# Patient Record
Sex: Female | Born: 1960 | Race: White | Hispanic: No | Marital: Married | Smoking: Never smoker
Health system: Southern US, Community
[De-identification: ages and names within clinical notes are randomized; demographics above are authoritative.]

## PROBLEM LIST (undated history)

## (undated) DIAGNOSIS — F419 Anxiety disorder, unspecified: Secondary | ICD-10-CM

## (undated) DIAGNOSIS — Z87442 Personal history of urinary calculi: Secondary | ICD-10-CM

## (undated) DIAGNOSIS — F319 Bipolar disorder, unspecified: Secondary | ICD-10-CM

## (undated) DIAGNOSIS — M1990 Unspecified osteoarthritis, unspecified site: Secondary | ICD-10-CM

## (undated) HISTORY — PX: KIDNEY STONE SURGERY: SHX686

## (undated) HISTORY — PX: TONSILLECTOMY: SUR1361

---

## 2015-10-07 ENCOUNTER — Encounter (HOSPITAL_COMMUNITY): Payer: Self-pay | Admitting: Emergency Medicine

## 2015-10-07 ENCOUNTER — Observation Stay (HOSPITAL_COMMUNITY)
Admission: EM | Admit: 2015-10-07 | Discharge: 2015-10-09 | Disposition: A | Discharge status: Home / Self Care | Payer: Managed Care, Other (non HMO) | Attending: Urology | Admitting: Urology

## 2015-10-07 DIAGNOSIS — N200 Calculus of kidney: Secondary | ICD-10-CM

## 2015-10-07 DIAGNOSIS — I252 Old myocardial infarction: Secondary | ICD-10-CM | POA: Insufficient documentation

## 2015-10-07 DIAGNOSIS — R109 Unspecified abdominal pain: Secondary | ICD-10-CM

## 2015-10-07 DIAGNOSIS — N201 Calculus of ureter: Secondary | ICD-10-CM | POA: Diagnosis present

## 2015-10-07 DIAGNOSIS — N132 Hydronephrosis with renal and ureteral calculous obstruction: Secondary | ICD-10-CM | POA: Diagnosis not present

## 2015-10-07 DIAGNOSIS — I2510 Atherosclerotic heart disease of native coronary artery without angina pectoris: Secondary | ICD-10-CM | POA: Insufficient documentation

## 2015-10-07 HISTORY — DX: Bipolar disorder, unspecified: F31.9

## 2015-10-07 LAB — BASIC METABOLIC PANEL
Anion gap: 10 (ref 5–15)
BUN: 13 mg/dL (ref 6–20)
CALCIUM: 9.7 mg/dL (ref 8.9–10.3)
CHLORIDE: 106 mmol/L (ref 101–111)
CO2: 24 mmol/L (ref 22–32)
CREATININE: 1.07 mg/dL — AB (ref 0.44–1.00)
GFR calc non Af Amer: 57 mL/min — ABNORMAL LOW (ref >60)
Glucose, Bld: 132 mg/dL — ABNORMAL HIGH (ref 65–99)
Potassium: 3.6 mmol/L (ref 3.5–5.1)
SODIUM: 140 mmol/L (ref 135–145)

## 2015-10-07 LAB — CBC WITH DIFFERENTIAL/PLATELET
BASOS PCT: 0 %
Basophils Absolute: 0 10*3/uL (ref 0.0–0.1)
EOS ABS: 0.2 10*3/uL (ref 0.0–0.7)
Eosinophils Relative: 2 %
HEMATOCRIT: 48.2 % — AB (ref 36.0–46.0)
Hemoglobin: 15.6 g/dL — ABNORMAL HIGH (ref 12.0–15.0)
LYMPHS ABS: 1.8 10*3/uL (ref 0.7–4.0)
Lymphocytes Relative: 13 %
MCH: 27.8 pg (ref 26.0–34.0)
MCHC: 32.4 g/dL (ref 30.0–36.0)
MCV: 85.8 fL (ref 78.0–100.0)
MONO ABS: 0.8 10*3/uL (ref 0.1–1.0)
MONOS PCT: 6 %
Neutro Abs: 10.7 10*3/uL — ABNORMAL HIGH (ref 1.7–7.7)
Neutrophils Relative %: 79 %
Platelets: 247 10*3/uL (ref 150–400)
RBC: 5.62 MIL/uL — ABNORMAL HIGH (ref 3.87–5.11)
RDW: 13.7 % (ref 11.5–15.5)
WBC: 13.5 10*3/uL — ABNORMAL HIGH (ref 4.0–10.5)

## 2015-10-07 MED ORDER — SODIUM CHLORIDE 0.9 % IV SOLN
Freq: Once | INTRAVENOUS | Status: AC
  Administered 2015-10-07: 23:00:00 via INTRAVENOUS

## 2015-10-07 MED ORDER — HYDROMORPHONE HCL 1 MG/ML IJ SOLN
1.0000 mg | Freq: Once | INTRAMUSCULAR | Status: AC
  Administered 2015-10-07: 1 mg via INTRAVENOUS
  Filled 2015-10-07: qty 1

## 2015-10-07 MED ORDER — ONDANSETRON HCL 4 MG/2ML IJ SOLN
4.0000 mg | Freq: Once | INTRAMUSCULAR | Status: AC
  Administered 2015-10-07: 4 mg via INTRAVENOUS
  Filled 2015-10-07: qty 2

## 2015-10-07 MED ORDER — KETOROLAC TROMETHAMINE 30 MG/ML IJ SOLN
30.0000 mg | Freq: Once | INTRAMUSCULAR | Status: AC
  Administered 2015-10-07: 30 mg via INTRAVENOUS
  Filled 2015-10-07: qty 1

## 2015-10-07 NOTE — ED Triage Notes (Signed)
Patient arrives with complaint of right groin pain and intermittent NV. States onset of aching type pain 1 week ago with increase in pain today and change in character to sharp. Patient sweating and crying out in pain while in triage.

## 2015-10-08 ENCOUNTER — Encounter (HOSPITAL_COMMUNITY): Payer: Self-pay | Admitting: Medical

## 2015-10-08 ENCOUNTER — Observation Stay (HOSPITAL_COMMUNITY): Payer: Managed Care, Other (non HMO) | Admitting: Registered Nurse

## 2015-10-08 ENCOUNTER — Encounter (HOSPITAL_COMMUNITY)
Admission: EM | Disposition: A | Discharge status: Home / Self Care | Payer: Self-pay | Source: Home / Self Care | Attending: Emergency Medicine

## 2015-10-08 ENCOUNTER — Emergency Department (HOSPITAL_COMMUNITY): Payer: Managed Care, Other (non HMO)

## 2015-10-08 DIAGNOSIS — N201 Calculus of ureter: Secondary | ICD-10-CM | POA: Diagnosis present

## 2015-10-08 HISTORY — PX: CYSTOSCOPY/RETROGRADE/URETEROSCOPY/STONE EXTRACTION WITH BASKET: SHX5317

## 2015-10-08 LAB — URINALYSIS, ROUTINE W REFLEX MICROSCOPIC
BILIRUBIN URINE: NEGATIVE
GLUCOSE, UA: NEGATIVE mg/dL
KETONES UR: 15 mg/dL — AB
Nitrite: NEGATIVE
PH: 8 (ref 5.0–8.0)
Protein, ur: NEGATIVE mg/dL
SPECIFIC GRAVITY, URINE: 1.014 (ref 1.005–1.030)

## 2015-10-08 LAB — HEPATIC FUNCTION PANEL
ALBUMIN: 4.3 g/dL (ref 3.5–5.0)
ALK PHOS: 46 U/L (ref 38–126)
ALT: 16 U/L (ref 14–54)
AST: 36 U/L (ref 15–41)
BILIRUBIN TOTAL: 0.5 mg/dL (ref 0.3–1.2)
Bilirubin, Direct: <0.1 mg/dL — ABNORMAL LOW (ref 0.1–0.5)
Total Protein: 7.1 g/dL (ref 6.5–8.1)

## 2015-10-08 LAB — URINE MICROSCOPIC-ADD ON

## 2015-10-08 SURGERY — CYSTOSCOPY, WITH CALCULUS REMOVAL USING BASKET
Anesthesia: General | Laterality: Right

## 2015-10-08 MED ORDER — ONDANSETRON HCL 4 MG/2ML IJ SOLN
4.0000 mg | INTRAMUSCULAR | Status: DC | PRN
  Administered 2015-10-08 (×2): 4 mg via INTRAVENOUS
  Filled 2015-10-08 (×2): qty 2

## 2015-10-08 MED ORDER — HYDROMORPHONE HCL 1 MG/ML IJ SOLN: 0.2500 mg | INTRAMUSCULAR | Status: DC | PRN

## 2015-10-08 MED ORDER — FENTANYL CITRATE (PF) 100 MCG/2ML IJ SOLN
50.0000 ug | INTRAMUSCULAR | Status: DC | PRN
  Administered 2015-10-08 (×5): 50 ug via INTRAVENOUS
  Filled 2015-10-08 (×5): qty 2

## 2015-10-08 MED ORDER — DEXAMETHASONE SODIUM PHOSPHATE 10 MG/ML IJ SOLN
INTRAMUSCULAR | Status: DC | PRN
  Administered 2015-10-08: 10 mg via INTRAVENOUS

## 2015-10-08 MED ORDER — PROPOFOL 10 MG/ML IV BOLUS
INTRAVENOUS | Status: DC | PRN
  Administered 2015-10-08: 200 mg via INTRAVENOUS

## 2015-10-08 MED ORDER — LACTATED RINGERS IV SOLN
INTRAVENOUS | Status: DC | PRN
  Administered 2015-10-08: 10:00:00 via INTRAVENOUS

## 2015-10-08 MED ORDER — FENTANYL CITRATE (PF) 100 MCG/2ML IJ SOLN
INTRAMUSCULAR | Status: DC | PRN
  Administered 2015-10-08: 100 ug via INTRAVENOUS

## 2015-10-08 MED ORDER — GENTAMICIN SULFATE 40 MG/ML IJ SOLN
500.0000 mg | Freq: Once | INTRAVENOUS | Status: AC
  Administered 2015-10-08: 500 mg via INTRAVENOUS
  Filled 2015-10-08: qty 12.5

## 2015-10-08 MED ORDER — SENNOSIDES-DOCUSATE SODIUM 8.6-50 MG PO TABS
1.0000 | ORAL_TABLET | Freq: Two times a day (BID) | ORAL | Status: DC
  Administered 2015-10-08 – 2015-10-09 (×2): 1 via ORAL
  Filled 2015-10-08 (×2): qty 1

## 2015-10-08 MED ORDER — DEXAMETHASONE SODIUM PHOSPHATE 10 MG/ML IJ SOLN
INTRAMUSCULAR | Status: AC
  Filled 2015-10-08: qty 1

## 2015-10-08 MED ORDER — FENTANYL CITRATE (PF) 100 MCG/2ML IJ SOLN
50.0000 ug | Freq: Once | INTRAMUSCULAR | Status: AC
  Administered 2015-10-08: 50 ug via INTRAVENOUS
  Filled 2015-10-08: qty 2

## 2015-10-08 MED ORDER — HYDROMORPHONE HCL 1 MG/ML IJ SOLN
0.5000 mg | INTRAMUSCULAR | Status: DC | PRN
  Administered 2015-10-08 (×2): 1 mg via INTRAVENOUS
  Filled 2015-10-08 (×2): qty 1

## 2015-10-08 MED ORDER — SUCCINYLCHOLINE CHLORIDE 20 MG/ML IJ SOLN
INTRAMUSCULAR | Status: DC | PRN
  Administered 2015-10-08: 140 mg via INTRAVENOUS

## 2015-10-08 MED ORDER — SODIUM CHLORIDE 0.9 % IR SOLN
Status: DC | PRN
  Administered 2015-10-08: 3000 mL

## 2015-10-08 MED ORDER — LABETALOL HCL 5 MG/ML IV SOLN
INTRAVENOUS | Status: DC | PRN
  Administered 2015-10-08: 5 mg via INTRAVENOUS

## 2015-10-08 MED ORDER — SODIUM CHLORIDE 0.9 % IV SOLN
INTRAVENOUS | Status: DC | PRN
  Administered 2015-10-08: 20 mL

## 2015-10-08 MED ORDER — MIDAZOLAM HCL 5 MG/5ML IJ SOLN
INTRAMUSCULAR | Status: DC | PRN
  Administered 2015-10-08: 2 mg via INTRAVENOUS

## 2015-10-08 MED ORDER — PROMETHAZINE HCL 25 MG/ML IJ SOLN
25.0000 mg | Freq: Four times a day (QID) | INTRAMUSCULAR | Status: DC | PRN
  Administered 2015-10-08: 25 mg via INTRAVENOUS
  Filled 2015-10-08: qty 1

## 2015-10-08 MED ORDER — ACETAMINOPHEN 10 MG/ML IV SOLN
INTRAVENOUS | Status: AC
  Filled 2015-10-08: qty 100

## 2015-10-08 MED ORDER — LAMOTRIGINE 100 MG PO TABS: 200.0000 mg | ORAL_TABLET | Freq: Every evening | ORAL | Status: DC

## 2015-10-08 MED ORDER — MIDAZOLAM HCL 2 MG/2ML IJ SOLN
INTRAMUSCULAR | Status: AC
  Filled 2015-10-08: qty 2

## 2015-10-08 MED ORDER — ONDANSETRON HCL 4 MG/2ML IJ SOLN
INTRAMUSCULAR | Status: DC | PRN
  Administered 2015-10-08: 4 mg via INTRAVENOUS

## 2015-10-08 MED ORDER — ONDANSETRON HCL 4 MG/2ML IJ SOLN
INTRAMUSCULAR | Status: AC
  Filled 2015-10-08: qty 2

## 2015-10-08 MED ORDER — QUETIAPINE FUMARATE 200 MG PO TABS
200.0000 mg | ORAL_TABLET | Freq: Every day | ORAL | Status: DC
  Administered 2015-10-08: 200 mg via ORAL
  Filled 2015-10-08: qty 1

## 2015-10-08 MED ORDER — KCL IN DEXTROSE-NACL 20-5-0.45 MEQ/L-%-% IV SOLN
INTRAVENOUS | Status: DC
  Administered 2015-10-08 – 2015-10-09 (×3): via INTRAVENOUS
  Filled 2015-10-08 (×4): qty 1000

## 2015-10-08 MED ORDER — ACETAMINOPHEN 10 MG/ML IV SOLN
INTRAVENOUS | Status: DC | PRN
  Administered 2015-10-08: 1000 mg via INTRAVENOUS

## 2015-10-08 MED ORDER — OXYCODONE-ACETAMINOPHEN 5-325 MG PO TABS
1.0000 | ORAL_TABLET | ORAL | Status: DC | PRN
  Administered 2015-10-08 – 2015-10-09 (×2): 2 via ORAL
  Filled 2015-10-08 (×2): qty 2

## 2015-10-08 MED ORDER — LIDOCAINE HCL (CARDIAC) 10 MG/ML IV SOLN
INTRAVENOUS | Status: DC | PRN
  Administered 2015-10-08: 75 mg via INTRAVENOUS

## 2015-10-08 MED ORDER — LURASIDONE HCL 20 MG PO TABS
20.0000 mg | ORAL_TABLET | Freq: Every evening | ORAL | Status: DC
  Filled 2015-10-08: qty 1

## 2015-10-08 MED ORDER — KETOROLAC TROMETHAMINE 30 MG/ML IJ SOLN
INTRAMUSCULAR | Status: AC
  Filled 2015-10-08: qty 1

## 2015-10-08 MED ORDER — FENTANYL CITRATE (PF) 100 MCG/2ML IJ SOLN
INTRAMUSCULAR | Status: AC
  Filled 2015-10-08: qty 2

## 2015-10-08 MED ORDER — LIDOCAINE 2% (20 MG/ML) 5 ML SYRINGE
INTRAMUSCULAR | Status: AC
  Filled 2015-10-08: qty 5

## 2015-10-08 MED ORDER — ALPRAZOLAM 1 MG PO TABS
1.0000 mg | ORAL_TABLET | Freq: Three times a day (TID) | ORAL | Status: DC | PRN
  Administered 2015-10-09: 1 mg via ORAL
  Filled 2015-10-08: qty 1

## 2015-10-08 MED ORDER — KETOROLAC TROMETHAMINE 15 MG/ML IJ SOLN
15.0000 mg | Freq: Four times a day (QID) | INTRAMUSCULAR | Status: DC
  Administered 2015-10-08 – 2015-10-09 (×3): 15 mg via INTRAVENOUS
  Filled 2015-10-08 (×3): qty 1

## 2015-10-08 MED ORDER — ONDANSETRON HCL 4 MG/2ML IJ SOLN
4.0000 mg | Freq: Once | INTRAMUSCULAR | Status: AC
  Administered 2015-10-08: 4 mg via INTRAVENOUS
  Filled 2015-10-08: qty 2

## 2015-10-08 MED ORDER — PROPOFOL 10 MG/ML IV BOLUS
INTRAVENOUS | Status: AC
  Filled 2015-10-08: qty 20

## 2015-10-08 SURGICAL SUPPLY — 20 items
BAG URO CATCHER STRL LF (MISCELLANEOUS) ×2 IMPLANT
BASKET LASER NITINOL 1.9FR (BASKET) IMPLANT
CATH INTERMIT  6FR 70CM (CATHETERS) ×2 IMPLANT
CLOTH BEACON ORANGE TIMEOUT ST (SAFETY) ×2 IMPLANT
FIBER LASER FLEXIVA 1000 (UROLOGICAL SUPPLIES) IMPLANT
FIBER LASER FLEXIVA 365 (UROLOGICAL SUPPLIES) ×2 IMPLANT
FIBER LASER FLEXIVA 550 (UROLOGICAL SUPPLIES) IMPLANT
FIBER LASER TRAC TIP (UROLOGICAL SUPPLIES) IMPLANT
GLOVE BIOGEL M STRL SZ7.5 (GLOVE) ×2 IMPLANT
GOWN STRL REUS W/TWL LRG LVL3 (GOWN DISPOSABLE) ×4 IMPLANT
GUIDEWIRE ANG ZIPWIRE 038X150 (WIRE) ×2 IMPLANT
GUIDEWIRE STR DUAL SENSOR (WIRE) ×2 IMPLANT
IV NS 1000ML (IV SOLUTION) ×1
IV NS 1000ML BAXH (IV SOLUTION) ×1 IMPLANT
MANIFOLD NEPTUNE II (INSTRUMENTS) ×2 IMPLANT
PACK CYSTO (CUSTOM PROCEDURE TRAY) ×2 IMPLANT
STENT POLARIS 5FRX24 (STENTS) ×2 IMPLANT
SYR CONTROL 10ML LL (SYRINGE) ×2 IMPLANT
TUBE FEEDING 8FR 16IN STR KANG (MISCELLANEOUS) ×2 IMPLANT
TUBING CONNECTING 10 (TUBING) ×2 IMPLANT

## 2015-10-08 NOTE — ED Notes (Signed)
Attempted to call report

## 2015-10-08 NOTE — Anesthesia Postprocedure Evaluation (Signed)
Anesthesia Post Note  Patient: Nestor LewandowskyMarsha Gilvin  Procedure(s) Performed: Procedure(s) (LRB): CYSTOSCOPY/RETROGRADE/URETEROSCOPY/STONE EXTRACTION WITH BASKET LASER (Right)  Patient location during evaluation: PACU Anesthesia Type: General Level of consciousness: awake Pain management: pain level controlled Vital Signs Assessment: post-procedure vital signs reviewed and stable Respiratory status: spontaneous breathing Cardiovascular status: stable Anesthetic complications: no    Last Vitals:  Vitals:   10/08/15 1200 10/08/15 1223  BP: (!) 146/81 (!) 147/83  Pulse: 84 83  Resp: 13 14  Temp: 36.8 C 37 C    Last Pain:  Vitals:   10/08/15 1200  TempSrc:   PainSc: 0-No pain                 EDWARDS,Myonna Chisom

## 2015-10-08 NOTE — ED Notes (Signed)
Paged urology x2

## 2015-10-08 NOTE — Anesthesia Preprocedure Evaluation (Addendum)
Anesthesia Evaluation  Patient identified by MRN, date of birth, ID band Patient awake  General Assessment Comment:Patient presenting with severe n/v in preop hoding area. Risks of GA particularly aspiration discussed with patient in detail. Dr. Berneice HeinrichManny informed as well. CE  Reviewed: Allergy & Precautions, NPO status , Patient's Chart, lab work & pertinent test results  Airway Mallampati: II  TM Distance: >3 FB     Dental   Pulmonary    breath sounds clear to auscultation       Cardiovascular + CAD and + Past MI   Rhythm:Regular Rate:Normal     Neuro/Psych    GI/Hepatic Neg liver ROS, GI history noted. CE   Endo/Other  negative endocrine ROS  Renal/GU History noted. CE     Musculoskeletal   Abdominal   Peds  Hematology   Anesthesia Other Findings   Reproductive/Obstetrics                            Anesthesia Physical Anesthesia Plan  ASA: III  Anesthesia Plan: General   Post-op Pain Management:    Induction: Intravenous, Rapid sequence and Cricoid pressure planned  Airway Management Planned: Oral ETT  Additional Equipment:   Intra-op Plan:   Post-operative Plan: Possible Post-op intubation/ventilation  Informed Consent: I have reviewed the patients History and Physical, chart, labs and discussed the procedure including the risks, benefits and alternatives for the proposed anesthesia with the patient or authorized representative who has indicated his/her understanding and acceptance.   Dental advisory given  Plan Discussed with: CRNA and Anesthesiologist  Anesthesia Plan Comments:        Anesthesia Quick Evaluation

## 2015-10-08 NOTE — ED Provider Notes (Signed)
Care assumed from [facility], [place]C.  Nestor LewandowskyMarsha Cabler is a 55 y.o. female presents with right sided flank pain and long history of her current kidney stones often with surgical management. She reports they are calcium stones. Patient [place] and has not established care with a primary care or urologist here. Symptoms today are the same as previous stones.  She reports that the pain is sharp and stabbing and severe with radiation to the groin. She has had associated nausea and vomiting along with dysuria.   Physical Exam  BP 153/76   Pulse 96   Temp 97.6 F (36.4 C) (Oral)   Resp 14   SpO2 98%   Physical Exam  Constitutional: She appears well-developed and well-nourished. She appears distressed.  HENT:  Head: Normocephalic.  Eyes: Conjunctivae are normal. No scleral icterus.  Neck: Normal range of motion.  Cardiovascular: Normal rate and intact distal pulses.   Pulmonary/Chest: Effort normal.  Musculoskeletal: Normal range of motion.  Neurological: She is alert.  Skin: Skin is warm. She is diaphoretic. There is pallor.   Clinical Course  Value Comment By Time   Pt with hx of 30+ kidney stones.   Dierdre ForthHannah Calyse Murcia, PA-C 09/23 0206  CT Renal Stone Study 6 x 4 mm stone with severe hydronephrosis.   Dahlia ClientHannah Jasia Hiltunen, PA-C 09/23 0206  Leukocytes, UA: (!) SMALL Urinalysis without evidence of urinary tract infection. Dierdre ForthHannah Marylynn Rigdon, PA-C 09/23 0220   Long discussion with patient regarding CT scan, size of stone. Pain control. She reports that a stone this size will need surgical management and wishes for admission. She does not wish to try outpatient trial of stone passage. Dierdre ForthHannah Georgeanne Frankland, PA-C 09/23 0221   Discussed with Dr. Berneice HeinrichManny who will evaluate this AM Dierdre ForthHannah Tabria Steines, PA-C 09/23 0432   [facility] Dierdre ForthHannah Maclain Cohron, PA-C 09/23 [id number]    ED Course  Procedures  Right flank pain  Kidney  stone    MDM  Plan: CT scan pending.  Will dispo accordingly.     6:33 AM Pt with refractory renal colic and obstructing stone of 6mm.  Pt evaluated by Dr. Berneice HeinrichManny who [facility].       Dahlia ClientHannah Sheriece Jefcoat, PA-C 10/08/15 [phone]    Shon Batonourtney F Horton, MD 10/09/15 [phone]

## 2015-10-08 NOTE — Anesthesia Procedure Notes (Signed)
Procedure Name: Intubation Date/Time: 10/08/2015 10:49 AM Performed by: Illene SilverEVANS, Gwynn Chalker E Pre-anesthesia Checklist: Patient identified, Emergency Drugs available, Suction available and Patient being monitored Patient Re-evaluated:Patient Re-evaluated prior to inductionOxygen Delivery Method: Circle system utilized Preoxygenation: Pre-oxygenation with 100% oxygen Intubation Type: IV induction, Rapid sequence and Cricoid Pressure applied Laryngoscope Size: Mac and 4 Grade View: Grade I Tube type: Oral Tube size: 7.5 mm Number of attempts: 1 Airway Equipment and Method: Stylet and Oral airway Placement Confirmation: ETT inserted through vocal cords under direct vision,  positive ETCO2 and breath sounds checked- equal and bilateral Secured at: 21 cm Tube secured with: Tape Dental Injury: Teeth and Oropharynx as per pre-operative assessment

## 2015-10-08 NOTE — ED Notes (Signed)
Dr. Gardiner RhymeZavits and Tresa EndoKelly PA preforming bedside us

## 2015-10-08 NOTE — Addendum Note (Signed)
Addendum  created 10/08/15 1301 by Illene SilverJanet E Norrine Ballester, CRNA   Anesthesia Intra Meds edited

## 2015-10-08 NOTE — Progress Notes (Signed)
Pt still with some nausea and emesis post-op, improved some. JJ stent also displaced after voiding and exiting urethral meatus.   I pulled stent out comletely.  Change nausea med to promethazine.  Remain in house, likely DC tomorrow AM.

## 2015-10-08 NOTE — ED Provider Notes (Signed)
[facility]-EMERGENCY DEPT Provider Note   CSN: [id number] Arrival date & time: 10/07/15  2158     History   Chief Complaint Chief Complaint  Patient presents with  . Groin Pain  . Nephrolithiasis    HPI Elizabeth Prince is a 55 y.o. female who presents with R flank and groin pain. PMH significant for recurrent kidney stones, CAD, hx of MI, bipolar d/o. She estimates she has had over 20 stones in her life. Has had them analyzed in the past and they were Ca stones. Past surgical hx significant for lithotripsy.  [place]. She states her pain is exactly the same as prior stones however this time it is more severe. Usually she passes the stone. This time she has had pain for the past week, which was tolerable, however today it became sharp, stabbing, and severe in nature. It is constant and radiates to the groin. Nothing has made it better or worse. She has had associated N/V and dysuria. She denies fever, chills, chest pain, SOB, upper abdominal pain, L sided pain, diarrhea, vaginal complaints.  HPI  Past Medical History:  Diagnosis Date  . Bipolar 1 disorder (HCC)     There are no active problems to display for this patient.   Past Surgical History:  Procedure Laterality Date  . KIDNEY STONE SURGERY      OB History    No data available       Home Medications    Prior to Admission medications   Not on File    Family History History reviewed. No pertinent family history.  Social History Social History  Substance Use Topics  . Smoking status: Never Smoker  . Smokeless tobacco: Never Used  . Alcohol use No     Allergies   Review of patient's allergies indicates no known allergies.   Review of Systems Review of Systems  Constitutional: Positive for diaphoresis. Negative for chills and fever.  Respiratory: Negative for shortness of breath.   Cardiovascular: Negative for chest pain.  Gastrointestinal: Positive for abdominal  pain, nausea and vomiting. Negative for diarrhea.  Genitourinary: Positive for dysuria and flank pain. Negative for vaginal bleeding and vaginal discharge.  All other systems reviewed and are negative.    Physical Exam Updated Vital Signs BP (!) 174/102 (BP Location: Right Arm)   Pulse 74   Temp 97.6 F (36.4 C) (Oral)   Resp 14   SpO2 98%   Physical Exam  Constitutional: She is oriented to person, place, and time. She appears well-developed and well-nourished. She appears distressed.  Writhing in pain  HENT:  Head: Normocephalic and atraumatic.  Eyes: Conjunctivae are normal. Pupils are equal, round, and reactive to light. Right eye exhibits no discharge. Left eye exhibits no discharge. No scleral icterus.  Neck: Normal range of motion. Neck supple.  Cardiovascular: Normal rate and regular rhythm.  Exam reveals no gallop and no friction rub.   No murmur heard. Pulmonary/Chest: Effort normal and breath sounds normal. No respiratory distress. She has no wheezes. She has no rales. She exhibits no tenderness.  Abdominal: Soft. Bowel sounds are normal. She exhibits no distension and no mass. There is tenderness. There is no rebound and no guarding. No hernia.  R CVA tenderness; RLQ and R groin tenderness  Musculoskeletal: She exhibits no edema.  Neurological: She is alert and oriented to person, place, and time.  Skin: Skin is warm and dry.  Psychiatric: She has a normal mood  and affect. Her behavior is normal.  Nursing note and vitals reviewed.    ED Treatments / Results  Labs (all labs ordered are listed, but only abnormal results are displayed) Labs Reviewed  CBC WITH DIFFERENTIAL/PLATELET - Abnormal; Notable for the following:       Result Value   WBC 13.5 (*)    RBC 5.62 (*)    Hemoglobin 15.6 (*)    HCT 48.2 (*)    Neutro Abs 10.7 (*)    All other components within normal limits  BASIC METABOLIC PANEL - Abnormal; Notable for the following:    Glucose, Bld 132 (*)     Creatinine, Ser 1.07 (*)    GFR calc non Af Amer 57 (*)    All other components within normal limits  HEPATIC FUNCTION PANEL - Abnormal; Notable for the following:    Bilirubin, Direct <0.1 (*)    All other components within normal limits  URINALYSIS, ROUTINE W REFLEX MICROSCOPIC (NOT AT [facility])    EKG  EKG Interpretation None       Radiology No results found.  Procedures Procedures (including critical care time)  Medications Ordered in ED Medications  0.9 %  sodium chloride infusion ( Intravenous New Bag/Given 10/07/15 2240)  HYDROmorphone (DILAUDID) injection 1 mg (1 mg Intravenous Given 10/07/15 2250)  ondansetron (ZOFRAN) injection 4 mg (4 mg Intravenous Given 10/07/15 2250)  ketorolac (TORADOL) 30 MG/ML injection 30 mg (30 mg Intravenous Given 10/07/15 2353)  fentaNYL (SUBLIMAZE) injection 50 mcg (50 mcg Intravenous Given 10/08/15 0021)     Initial Impression / Assessment and Plan / ED Course  I have reviewed the triage vital signs and the nursing notes.  Pertinent labs & imaging results that were available during my care of the patient were reviewed by me and considered in my medical decision making (see chart for details).  Clinical Course   55 year old female presents with symptoms and exam findings consistent with a kidney stone. Patient is afebrile, not tachycardic or tachypneic, and not hypoxic. She is markedly hypertensive - she is in severe pain. Does not have hx of HTN and not currently on any meds for this. Bedside US confirms hydronephrosis therefore less likely this is appendicitis. Please see Dr. Jodi Mourning note for detail. CBC remarkable for leukocytosis of 13.5 and hemoglobin of 15.6. CMP remarkable for mildly elevated SCR of 1.07 with no comparison as well as mild hyperglycemia. Dilaudid, Toradol, Zofran, IVF started.  On recheck patient is still in pain. Fentanyl given. UA pending. Discussed with patient on whether or not she would want to pursue a CT. Patient  states she wants to know if this would be a passable stone. CT Renal ordered. Dispo will be decided pending UA and symptom control. Patient discussed with H Muthersbaugh PA-C who will assume care of patient.  Final Clinical Impressions(s) / ED Diagnoses   Final diagnoses:  Right flank pain    New Prescriptions New Prescriptions   No medications on file     Bethel Born, PA-C 10/08/15 0124    Blane Ohara, MD 10/10/15 [phone]

## 2015-10-08 NOTE — Brief Op Note (Signed)
10/07/2015 - 10/08/2015  11:25 AM  PATIENT:  Nestor LewandowskyMarsha Silvio  55 y.o. female  PRE-OPERATIVE DIAGNOSIS:  Rt. Ureteral Stone  POST-OPERATIVE DIAGNOSIS:  right ureteral stone   PROCEDURE:  Procedure(s): CYSTOSCOPY/RETROGRADE/URETEROSCOPY/STONE EXTRACTION WITH BASKET LASER (Right)  SURGEON:  Surgeon(s) and Role:    * Sebastian Acheheodore Juanita Streight, MD - Primary  PHYSICIAN ASSISTANT:   ASSISTANTS: none   ANESTHESIA:   general  EBL:  No intake/output data recorded.  BLOOD ADMINISTERED:none  DRAINS: none   LOCAL MEDICATIONS USED:  NONE  SPECIMEN:  Source of Specimen:  Rt ureteral stone fragments  DISPOSITION OF SPECIMEN:  [facility] for compositional analysis  COUNTS:  YES  TOURNIQUET:  * No tourniquets in log *  DICTATION: .Other Dictation: Dictation Number [id number]  PLAN OF CARE: Admit for overnight observation  PATIENT DISPOSITION:  PACU - hemodynamically stable.   Delay start of Pharmacological VTE agent (>24hrs) due to surgical blood loss or risk of bleeding: yes

## 2015-10-08 NOTE — H&P (Signed)
Elizabeth Prince is an 55 y.o. female.    Chief Complaint: Right Ureteral Stone with Refractory Colic  HPI:   1 - Recurrent Nephrolithiasis -  Pre 2017 - ureterosopy x1 09/2015 - Rt 38m distal stone with severe hydro and refratory colic. Left 356mnon-obstructing renal stone as well.  PMH sig for TNA, ureteroscopy, bipolar.   Today "Elizabeth Prince seen in ER for above. She states her pain and nausea is refractory and adamantly wants treatment. NPO >8hrs. No fevers. UA without overt infectious parameters.    Past Medical History:  Diagnosis Date  . B[facility] 1 disorder ([facility]  . CAD (coronary artery disease)   . [facility]    Past Surgical History:  Procedure Laterality Date  . KIDNEY STONE SURGERY      History reviewed. No pertinent family history. Social History:  reports that she has never smoked. She has never used smokeless tobacco. She reports that she does not drink alcohol or use drugs.  Allergies: No Known Allergies   (Not in a hospital admission)  Results for orders placed or performed during the hospital encounter of 10/07/15 (from the past 48 hour(s))  Urinalysis, [facility] w reflex microscopic ([facility]    Status: Abnormal   Collection Time: 10/07/15 10:23 PM  Result Value Ref Range   Color, Urine YELLOW YELLOW   APPearance CLEAR CLEAR   Specific Gravity, Urine 1.014 1.005 - 1.030   pH 8.0 5.0 - 8.0   Glucose, UA NEGATIVE NEGATIVE mg/dL   Hgb urine dipstick LARGE (A) NEGATIVE   Bilirubin Urine NEGATIVE NEGATIVE   Ketones, ur 15 (A) NEGATIVE mg/dL   Protein, ur NEGATIVE NEGATIVE mg/dL   Nitrite NEGATIVE NEGATIVE   Leukocytes, UA SMALL (A) NEGATIVE  Urine microscopic-add on     Status: Abnormal   Collection Time: 10/07/15 10:23 PM  Result Value Ref Range   Squamous Epithelial / LPF 0-5 (A) NONE SEEN   WBC, UA 6-30 0 - 5 WBC/hpf   RBC / HPF TOO NUMEROUS TO COUNT 0 - 5 RBC/hpf   Bacteria, UA FEW (A) NONE SEEN  CBC with Differential/Platelet      Status: Abnormal   Collection Time: 10/07/15 10:40 PM  Result Value Ref Range   WBC 13.5 (H) 4.0 - 10.5 K/uL   RBC 5.62 (H) 3.87 - 5.11 MIL/uL   Hemoglobin 15.6 (H) 12.0 - 15.0 g/dL   HCT 48.2 (H) 36.0 - 46.0 %   MCV 85.8 78.0 - 100.0 fL   MCH 27.8 26.0 - 34.0 pg   MCHC 32.4 30.0 - 36.0 g/dL   RDW 13.7 11.5 - 15.5 %   Platelets 247 150 - 400 K/uL   Neutrophils Relative % 79 %   Neutro Abs 10.7 (H) 1.7 - 7.7 K/uL   Lymphocytes Relative 13 %   Lymphs Abs 1.8 0.7 - 4.0 K/uL   Monocytes Relative 6 %   Monocytes Absolute 0.8 0.1 - 1.0 K/uL   Eosinophils Relative 2 %   Eosinophils Absolute 0.2 0.0 - 0.7 K/uL   Basophils Relative 0 %   Basophils Absolute 0.0 0.0 - 0.1 K/uL  Basic metabolic panel     Status: Abnormal   Collection Time: 10/07/15 10:40 PM  Result Value Ref Range   Sodium 140 135 - 145 mmol/L   Potassium 3.6 3.5 - 5.1 mmol/L   Chloride 106 101 - 111 mmol/L   CO2 24 22 - 32 mmol/L   Glucose, Bld 132 (  H) 65 - 99 mg/dL   BUN 13 6 - 20 mg/dL   Creatinine, Ser 1.07 (H) 0.44 - 1.00 mg/dL   Calcium 9.7 8.9 - 10.3 mg/dL   GFR calc non Af Amer 57 (L) >60 mL/min   GFR calc Af Amer >60 >60 mL/min    Comment: (NOTE) The eGFR has been calculated using the CKD EPI equation. This calculation has not been validated in all clinical situations. eGFR's persistently <60 mL/min signify possible Chronic Kidney Disease.    Anion gap 10 5 - 15  Hepatic function panel     Status: Abnormal   Collection Time: 10/07/15 10:40 PM  Result Value Ref Range   Total Protein 7.1 6.5 - 8.1 g/dL   Albumin 4.3 3.5 - 5.0 g/dL   AST 36 15 - 41 U/L   ALT 16 14 - 54 U/L   Alkaline Phosphatase 46 38 - 126 U/L   Total Bilirubin 0.5 0.3 - 1.2 mg/dL   Bilirubin, Direct <0.1 (L) 0.1 - 0.5 mg/dL   Indirect Bilirubin NOT CALCULATED 0.3 - 0.9 mg/dL   Ct Renal Stone Study  Result Date: 10/08/2015 CLINICAL DATA:  Acute onset of right flank pain, with nausea and vomiting. Initial encounter. EXAM: CT ABDOMEN  AND PELVIS WITHOUT CONTRAST TECHNIQUE: Multidetector CT imaging of the abdomen and pelvis was performed following the standard protocol without IV contrast. COMPARISON:  None. FINDINGS: Lower chest: The visualized lung bases are grossly clear. The visualized portions of the mediastinum are unremarkable. Hepatobiliary: A 6 mm hypodensity is noted at the left hepatic lobe, possibly reflecting a small cyst. The liver is otherwise unremarkable. The gallbladder is unremarkable in appearance. The common bile duct remains normal in caliber. Pancreas: The pancreas is within normal limits. Spleen: The spleen is enlarged, measuring 15.8 cm in length. Adrenals/Urinary Tract: The adrenal glands are unremarkable in appearance. There is moderate to severe right-sided hydronephrosis, with prominence of the proximal right ureter. Two obstructing right ureteral stones are noted at the mid to distal right ureter, the larger of which measures 6 x 4 mm. Right-sided perinephric stranding and fluid are seen. There is a nonobstructing 5 mm stone at the lower pole of the left kidney. No additional nonobstructing renal stones are identified. Stomach/Bowel: The stomach is unremarkable in appearance. The small bowel is within normal limits. The appendix is borderline normal in caliber, without evidence of appendicitis. The colon is decompressed and grossly unremarkable in appearance. Vascular/Lymphatic: Minimal calcification is seen along the distal abdominal aorta. The inferior vena cava is grossly unremarkable. No retroperitoneal lymphadenopathy is seen. No pelvic sidewall lymphadenopathy is identified. Reproductive: The bladder is mildly distended and within normal limits. The uterus is grossly unremarkable in appearance. The ovaries are relatively symmetric. No suspicious adnexal masses are seen. Other: No additional soft tissue abnormalities are seen. Musculoskeletal: No acute osseous abnormalities are identified. The visualized  musculature is unremarkable in appearance. IMPRESSION: 1. Moderate severe right-sided hydronephrosis, with 2 obstructing right ureteral stones noted at the mid to distal right ureter, the larger of which measures 6 x 4 mm. Right-sided perinephric stranding and fluid seen. 2. Nonobstructing 5 mm stone at the lower pole of the left kidney. 3. Splenomegaly noted. 4. Tiny 6 mm hypodensity at the left hepatic lobe may reflect a small cyst. Electronically Signed   By: Garald Balding M.D.   On: 10/08/2015 01:23    Review of Systems  Constitutional: Negative for chills and fever.  HENT: Negative.   Eyes: Negative.  Cardiovascular: Negative.   Gastrointestinal: Negative.   Genitourinary: Positive for flank pain. Negative for urgency.  Skin: Negative.   Neurological: Negative.   Endo/Heme/Allergies: Negative.   Psychiatric/Behavioral: Negative.     Blood pressure 150/81, pulse 82, temperature 97.6 F (36.4 C), temperature source Oral, resp. rate 14, SpO2 95 %. Physical Exam  Constitutional: She is oriented to person, place, and time. She appears well-developed.  Daughter at bedside in ER  HENT:  Head: Normocephalic.  Eyes: Pupils are equal, round, and reactive to light.  Neck: Normal range of motion.  Cardiovascular: Normal rate.   Respiratory: Effort normal.  GI: Soft.  Moderate truncal obesity  Genitourinary:  Genitourinary Comments: Rt CVAT  Musculoskeletal: Normal range of motion.  Neurological: She is alert and oriented to person, place, and time.  Skin: Skin is warm.  Psychiatric: She has a normal mood and affect. Her behavior is normal. Judgment and thought content normal.     Assessment/Plan  1 - Recurrent Nephrolithiasis - discussed options for current stone burden including medical therapy (<50% chance of passage), ureteroscopy (>90% chance of removal in single setting) v. SWL (cannot be done in urgent setting, but could be arranged for next week). She opts for ureteroscopy  today.  Risks, benefits, peri-op course, need for possible ureteral stents, need for possible staged approach discussed. Need for transfer to [facility] also discussed. She wants to proceed.  Alexis Frock, MD 10/08/2015, 5:18 AM

## 2015-10-08 NOTE — Transfer of Care (Signed)
Immediate Anesthesia Transfer of Care Note  Patient: Elizabeth LewandowskyMarsha Prince  Procedure(s) Performed: Procedure(s): CYSTOSCOPY/RETROGRADE/URETEROSCOPY/STONE EXTRACTION WITH BASKET LASER (Right)  Patient Location: PACU  Anesthesia Type:General  Level of Consciousness: awake, alert , oriented and patient cooperative  Airway & Oxygen Therapy: Patient Spontanous Breathing and Patient connected to face mask oxygen  Post-op Assessment: Report given to RN, Post -op Vital signs reviewed and stable and Patient moving all extremities X 4  Post vital signs: stable  Last Vitals:  Vitals:   10/08/15 1130 10/08/15 1145  BP: (!) 140/91 (!) 144/88  Pulse: 87 83  Resp: 14 14  Temp: 36.4 C     Last Pain:  Vitals:   10/08/15 1145  TempSrc:   PainSc: 0-No pain      Patients Stated Pain Goal: 3 (10/08/15 [id number])  Complications: No apparent anesthesia complications

## 2015-10-08 NOTE — ED Notes (Signed)
Called [facility] for transport 

## 2015-10-08 NOTE — ED Notes (Signed)
Patient transported to CT 

## 2015-10-09 MED ORDER — SENNOSIDES-DOCUSATE SODIUM 8.6-50 MG PO TABS: 1.0000 | ORAL_TABLET | Freq: Two times a day (BID) | ORAL | 0 refills | Status: DC

## 2015-10-09 MED ORDER — PROMETHAZINE HCL 25 MG PO TABS
25.0000 mg | ORAL_TABLET | Freq: Once | ORAL | Status: AC
  Administered 2015-10-09: 25 mg via ORAL
  Filled 2015-10-09: qty 1

## 2015-10-09 MED ORDER — OXYCODONE-ACETAMINOPHEN 5-325 MG PO TABS: 1.0000 | ORAL_TABLET | ORAL | 0 refills | Status: DC | PRN

## 2015-10-09 MED ORDER — PROMETHAZINE HCL 25 MG PO TABS: 25.0000 mg | ORAL_TABLET | Freq: Four times a day (QID) | ORAL | 0 refills | Status: DC | PRN

## 2015-10-09 NOTE — Discharge Summary (Signed)
Physician Discharge Summary  Patient ID: Elizabeth LewandowskyMarsha Prince MRN: [id number] DOB/AGE: 1960/08/26 55 y.o.  Admit date: 10/08/2015 Discharge date: 10/09/2015  Admission Diagnoses: Right Ureteral Stone  Discharge Diagnoses:  Active Problems:   Ureteral stone   Discharged Condition: good  Hospital Course:   Pt underwent urgent right ureteroscopic stone manipulation and ureteral stent placeemnt on 9/23, the day of admission to right side stone free. Her stent partially advanced beyond urethral meatus with voiding later that day therefore removed. By the morning of 9/24, the day of discharge, she is ambulatory, pain controlled on PO meds, afebrile, and felt to be adequate for discharge.   Consults: None  Significant Diagnostic Studies: labs: Cr <1.5  Treatments: surgery: right ureteroscopic stone manipulation and ureteral stent placeemnt on 9/23,  Discharge Exam: Blood pressure (!) 154/86, pulse 91, temperature 98.5 F (36.9 C), temperature source Oral, resp. rate 16, height 5\' 6"  (1.676 m), weight 96.2 kg (212 lb), SpO2 97 %. General appearance: alert, cooperative and appears stated age Eyes: negative Nose: Nares normal. Septum midline. Mucosa normal. No drainage or sinus tenderness. Throat: lips, mucosa, and tongue normal; teeth and gums normal Neck: no adenopathy, no carotid bruit, no JVD, supple, symmetrical, trachea midline and thyroid not enlarged, symmetric, no tenderness/mass/nodules Back: symmetric, no curvature. ROM normal. No CVA tenderness. Resp: non-labored on room air. Cardio: Nl rate GI: soft, non-tender; bowel sounds normal; no masses,  no organomegaly Extremities: extremities normal, atraumatic, no cyanosis or edema Skin: Skin color, texture, turgor normal. No rashes or lesions Lymph nodes: Cervical, supraclavicular, and axillary nodes normal. Neurologic: Grossly normal  Disposition: Final discharge disposition not confirmed     Medication List    TAKE these  medications   ALPRAZolam 1 MG tablet Commonly known as:  XANAX Take 1 mg by mouth 3 (three) times daily as needed for anxiety.   lamoTRIgine 200 MG tablet Commonly known as:  LAMICTAL Take 200 mg by mouth every evening.   LATUDA 20 MG Tabs tablet Generic drug:  lurasidone Take 20 mg by mouth every evening.   oxyCODONE-acetaminophen 5-325 MG tablet Commonly known as:  ROXICET Take 1 tablet by mouth every 4 (four) hours as needed for moderate pain or severe pain. Post-operatively   QUEtiapine 200 MG tablet Commonly known as:  SEROQUEL Take 200 mg by mouth at bedtime.   senna-docusate 8.6-50 MG tablet Commonly known as:  Senokot-S Take 1 tablet by mouth 2 (two) times daily. While taking pain meds to prevent constipation        Signed: Earlie Schank 10/09/2015, 8:09 AM

## 2015-10-09 NOTE — Discharge Instructions (Signed)
1 - You may have urinary urgency (bladder spasms) and bloody urine on / off  X few days. This is normal.  2 - Call MD or go to ER for fever >102, severe pain / nausea / vomiting not relieved by medications, or acute change in medical status

## 2015-10-09 NOTE — Progress Notes (Signed)
Pt discharged to home. DC instructions given. Prescriptions x 2 given for pain and nausea meds. Pt also given a note to return to work on Wednesday with no restrictions. Husband at bedside at time of discharge. Left unit in wheelchair pushed by this RN. No concerns voiced. Left in good condition. Husband drove pt to home.  VWilliams,rn.

## 2015-10-10 ENCOUNTER — Encounter (HOSPITAL_COMMUNITY): Payer: Self-pay | Admitting: Urology

## 2015-10-10 NOTE — Op Note (Signed)
NAMNestor Prince:  Mancuso, Brandy                ACCOUNT NO.:  [id number]  MEDICAL RECORD NO.:  [id number]  LOCATION:                                 FACILITY:  PHYSICIAN:  Sebastian Acheheodore Benito Lemmerman, MD     DATE OF BIRTH:  08-04-1960  DATE OF PROCEDURE: 10/08/2015                               OPERATIVE REPORT   DIAGNOSIS:  Right ureteral stone with refractory colic.  PROCEDURES: 1. Cystoscopy with right retrograde pyelogram and interpretation. 2. Right ureteroscopy with laser lithotripsy. 3. Insertion of right ureteral stent, 5 x 24 Polaris with tether.  ESTIMATED BLOOD LOSS:  Nil.  COMPLICATIONS:  None.  SPECIMENS:  Right distal ureteral stone fragments for compositional analysis.  FINDINGS: 1. Mild right hydroureteronephrosis to filling defect in the right     distal ureter excision of the stone. 2. Complete resolution of all stone fragments larger than 1/3rd mm     within the distal 3/4th of the bladder following laser lithotripsy     and basket extraction. 3. Successful placement of right ureteral stent, proximal in renal     pelvis and distal in urinary bladder.  INDICATION:  Ms. Anne HahnMcKay is a pleasant 55 year old lady who [place] [facility] who has history of recurrent nephrolithiasis.  She was found on workup of acute and very persistent right colicky flank pain to have a right distal ureteral stone approximately 6 mm.  Her pain was very difficult to control as was her nausea in the ER, urgent urologic consultation was sought.  We evaluated the patient and discussed options including medical therapy versus shockwave lithotripsy possibly next week versus more urgent intervention with ureteroscopic stone manipulation today.  She adamantly wished to proceed with the latter. Informed consent was obtained and placed in the medical record.  PROCEDURE IN DETAIL:  The patient being Elizabeth LewandowskyMarsha Tague verified. Procedure being right ureteroscopic stone manipulation was confirmed. Procedure  was carried out.  Time-out was performed.  Intravenous antibiotics were administered.  General endotracheal anesthesia introduced.  The patient placed into a low lithotomy position.  Sterile field was created by prepping and draping the patient's vagina, introitus, and proximal thighs using iodine x3.  Next, cystourethroscopy was performed using a 21-French rigid cystoscope with offset lens. Inspection of urinary bladder revealed no diverticula, calcifications, or papillary lesions.  Ureteral orifices appeared singleton.  The right ureteral orifice was cannulated with a 6-French end-hole catheter and right retrograde pyelogram was obtained.  Right retrograde pyelogram demonstrated a single right ureter with a single-system right kidney.  There was mild-to-moderate hydroureteronephrosis to a filling defect in the distal ureter consistent with known stone.  A 0.038 zip wire was advanced at the level of the upper pole and set aside as a safety wire.  An 8-French feeding tube was placed in the urinary bladder for pressure release.  Next, semi- rigid ureteroscopy was performed in the distal right ureter alongside a separate Sensor working wire.  As expected, there was a moderately impacted stone just above the level of the intramural ureter.  This appeared to be much too large for simple basketing.  As such, holmium laser energy applied to the stone using settings of  0.2 joules and 20 Hz fragmenting the stone in approximately 4 smaller pieces, which were then sequentially grasped on the long axis with an escape basket removed to the level of the urinary bladder.  Semi-rigid ureteroscopy of the more proximal right ureter allowing inspection of the distal 3/4th of the right ureter revealed no additional calcifications.  No mucosal abnormalities.  Given there was some mucosal edema at the site of prior stone impaction, it was felt that interval stenting would be warranted. As such, a new 5 x 24  Polaris-type stent was placed with remaining safety wire using fluoroscopic guidance.  Good proximal and distal deployment were noted.  Ureteral stone fragments irrigated via bladder, set aside for compositional analysis.  Tether was left in place for the stent fashioned to the inner thigh and the procedure was terminated. The patient tolerated the procedure well with no immediate periprocedural complications.  The patient was taken to the postanesthesia care unit in a stable condition.    ______________________________ [name], MD   ______________________________ [name], MD    TM/MEDQ  D:  10/08/2015  T:  10/08/2015  Job:  [id number]

## 2016-04-12 ENCOUNTER — Emergency Department (HOSPITAL_COMMUNITY): Payer: Commercial Managed Care - PPO

## 2016-04-12 ENCOUNTER — Emergency Department (HOSPITAL_COMMUNITY)
Admission: EM | Admit: 2016-04-12 | Discharge: 2016-04-12 | Disposition: A | Discharge status: Home / Self Care | Payer: Commercial Managed Care - PPO | Attending: Emergency Medicine | Admitting: Emergency Medicine

## 2016-04-12 ENCOUNTER — Encounter (HOSPITAL_COMMUNITY): Payer: Self-pay | Admitting: Emergency Medicine

## 2016-04-12 DIAGNOSIS — I252 Old myocardial infarction: Secondary | ICD-10-CM | POA: Diagnosis not present

## 2016-04-12 DIAGNOSIS — R109 Unspecified abdominal pain: Secondary | ICD-10-CM

## 2016-04-12 DIAGNOSIS — I2510 Atherosclerotic heart disease of native coronary artery without angina pectoris: Secondary | ICD-10-CM | POA: Diagnosis not present

## 2016-04-12 DIAGNOSIS — Z79899 Other long term (current) drug therapy: Secondary | ICD-10-CM | POA: Insufficient documentation

## 2016-04-12 LAB — CBC WITH DIFFERENTIAL/PLATELET
Basophils Absolute: 0 10*3/uL (ref 0.0–0.1)
Basophils Relative: 0 %
EOS ABS: 0.4 10*3/uL (ref 0.0–0.7)
Eosinophils Relative: 5 %
HEMATOCRIT: 42.6 % (ref 36.0–46.0)
HEMOGLOBIN: 13.9 g/dL (ref 12.0–15.0)
LYMPHS ABS: 2.5 10*3/uL (ref 0.7–4.0)
LYMPHS PCT: 34 %
MCH: 27.7 pg (ref 26.0–34.0)
MCHC: 32.6 g/dL (ref 30.0–36.0)
MCV: 85 fL (ref 78.0–100.0)
Monocytes Absolute: 0.5 10*3/uL (ref 0.1–1.0)
Monocytes Relative: 7 %
NEUTROS ABS: 3.9 10*3/uL (ref 1.7–7.7)
NEUTROS PCT: 54 %
Platelets: 171 10*3/uL (ref 150–400)
RBC: 5.01 MIL/uL (ref 3.87–5.11)
RDW: 13.7 % (ref 11.5–15.5)
WBC: 7.4 10*3/uL (ref 4.0–10.5)

## 2016-04-12 LAB — URINALYSIS, ROUTINE W REFLEX MICROSCOPIC
Bilirubin Urine: NEGATIVE
Glucose, UA: NEGATIVE mg/dL
Hgb urine dipstick: NEGATIVE
Ketones, ur: NEGATIVE mg/dL
Leukocytes, UA: NEGATIVE
Nitrite: NEGATIVE
PH: 5 (ref 5.0–8.0)
Protein, ur: NEGATIVE mg/dL
SPECIFIC GRAVITY, URINE: 1.029 (ref 1.005–1.030)

## 2016-04-12 LAB — BASIC METABOLIC PANEL
ANION GAP: 7 (ref 5–15)
BUN: 21 mg/dL — ABNORMAL HIGH (ref 6–20)
CHLORIDE: 105 mmol/L (ref 101–111)
CO2: 29 mmol/L (ref 22–32)
Calcium: 9 mg/dL (ref 8.9–10.3)
Creatinine, Ser: 0.78 mg/dL (ref 0.44–1.00)
GFR calc Af Amer: >60 mL/min (ref >60)
GFR calc non Af Amer: >60 mL/min (ref >60)
Glucose, Bld: 106 mg/dL — ABNORMAL HIGH (ref 65–99)
POTASSIUM: 3.8 mmol/L (ref 3.5–5.1)
SODIUM: 141 mmol/L (ref 135–145)

## 2016-04-12 MED ORDER — TAMSULOSIN HCL 0.4 MG PO CAPS: 0.4000 mg | ORAL_CAPSULE | Freq: Every day | ORAL | 0 refills | Status: DC

## 2016-04-12 MED ORDER — FENTANYL CITRATE (PF) 100 MCG/2ML IJ SOLN
50.0000 ug | Freq: Once | INTRAMUSCULAR | Status: DC
  Filled 2016-04-12: qty 2

## 2016-04-12 MED ORDER — HYDROCODONE-ACETAMINOPHEN 5-325 MG PO TABS: 1.0000 | ORAL_TABLET | Freq: Four times a day (QID) | ORAL | 0 refills | Status: DC | PRN

## 2016-04-12 MED ORDER — OXYCODONE-ACETAMINOPHEN 5-325 MG PO TABS: 1.0000 | ORAL_TABLET | Freq: Four times a day (QID) | ORAL | 0 refills | Status: DC | PRN

## 2016-04-12 MED ORDER — OXYCODONE-ACETAMINOPHEN 5-325 MG PO TABS
1.0000 | ORAL_TABLET | Freq: Once | ORAL | Status: AC
  Administered 2016-04-12: 1 via ORAL
  Filled 2016-04-12: qty 1

## 2016-04-12 MED ORDER — HYDROMORPHONE HCL 1 MG/ML IJ SOLN
1.0000 mg | Freq: Once | INTRAMUSCULAR | Status: AC
  Administered 2016-04-12: 1 mg via INTRAVENOUS
  Filled 2016-04-12: qty 1

## 2016-04-12 MED ORDER — ONDANSETRON 4 MG PO TBDP: 4.0000 mg | ORAL_TABLET | Freq: Three times a day (TID) | ORAL | 0 refills | Status: DC | PRN

## 2016-04-12 MED ORDER — KETOROLAC TROMETHAMINE 30 MG/ML IJ SOLN
30.0000 mg | Freq: Once | INTRAMUSCULAR | Status: AC
  Administered 2016-04-12: 30 mg via INTRAVENOUS
  Filled 2016-04-12: qty 1

## 2016-04-12 NOTE — ED Triage Notes (Signed)
Pt c/o stabbing colicky left flank pain radiating to left groin, dysuria, dark hematuria, nausea, urinary frequency x 3 days. Hx of renal calculi, feels like same. CVAT.

## 2016-04-12 NOTE — Discharge Instructions (Signed)

## 2016-04-12 NOTE — ED Notes (Addendum)
Error

## 2016-04-12 NOTE — ED Notes (Signed)
Pt. Unable to urinate at this time. Will collect urine when pt. Voids. Nurses aware. 

## 2016-04-12 NOTE — ED Provider Notes (Signed)
Emergency Department Provider Note   I have reviewed the triage vital signs and the nursing notes.   HISTORY  Chief Complaint Flank Pain and Hematuria   HPI Elizabeth Prince is a 56 y.o. female with PMH of AMI, Bipolar disorder, and kidney stone requiring intervention presents to the emergency department for evaluation of sudden onset left flank pain radiating from the back around to the anterior lower abdomen. Patient states she's had many kidney stones in the past and this feels very similar to prior stones. She had to have a stone removed late last year on the opposite side. She denies any associated dysuria, hesitancy, urgency. No fevers or chills. No chest pain or difficulty breathing. No vaginal bleeding or discharge. Patient reports intermittent severe pain. No modifying factors when pain begins. She reports that unlike previous stones she does feel like the stone is moving.   Past Medical History:  Diagnosis Date  . Bipolar 1 disorder (HCC)   . CAD (coronary artery disease)   . Myocardial infarction     Patient Active Problem List   Diagnosis Date Noted  . Ureteral stone 10/08/2015    Past Surgical History:  Procedure Laterality Date  . CYSTOSCOPY/RETROGRADE/URETEROSCOPY/STONE EXTRACTION WITH BASKET Right 10/08/2015   Procedure: CYSTOSCOPY/RETROGRADE/URETEROSCOPY/STONE EXTRACTION WITH BASKET LASER;  Surgeon: [name], MD;  Location: [facility];  Service: Urology;  Laterality: Right;  . KIDNEY STONE SURGERY      Current Outpatient Rx  . Order #: [id number] Class: Historical Med  . Order #: [id number] Class: Historical Med  . Order #: [id number] Class: Historical Med  . Order #: [id number] Class: Historical Med  . Order #: [id number] Class: Historical Med  . Order #: [id number] Class: Historical Med  . Order #: [id number] Class: Historical Med  . Order #: [id number] Class: Historical Med  . Order #: [id number] Class: Print  . Order #: [id number] Class: Print  . Order #:  [id number] Class: Print    Allergies Patient has no known allergies.  History reviewed. No pertinent family history.  Social History Social History  Substance Use Topics  . Smoking status: Never Smoker  . Smokeless tobacco: Never Used  . Alcohol use No    Review of Systems  10-point ROS otherwise negative.  ____________________________________________   PHYSICAL EXAM:  VITAL SIGNS: ED Triage Vitals  Enc Vitals Group     BP 04/12/16 1350 (!) 156/96     Pulse Rate 04/12/16 1350 91     Resp 04/12/16 1350 16     Temp 04/12/16 1350 97.9 F (36.6 C)     Temp Source 04/12/16 1350 Oral     SpO2 04/12/16 1350 96 %     Weight 04/12/16 1350 220 lb (99.8 kg)     Height 04/12/16 1350 5\' 7"  (1.702 m)     Pain Score 04/12/16 1400 10   Constitutional: Alert and oriented. Appears uncomfortable.  Eyes: Conjunctivae are normal. Head: Atraumatic. Nose: No congestion/rhinnorhea. Mouth/Throat: Mucous membranes are moist.  Oropharynx non-erythematous. Neck: No stridor.   Cardiovascular: Normal rate, regular rhythm. Good peripheral circulation. Grossly normal heart sounds.   Respiratory: Normal respiratory effort.  No retractions. Lungs CTAB. Gastrointestinal: Soft and non tender to palpation.  No distention. No CVA tenderness.  Musculoskeletal: No lower extremity tenderness nor edema. No gross deformities of extremities. Neurologic:  Normal speech and language. No gross focal neurologic deficits are appreciated.  Skin:  Skin is warm, dry and intact. No rash noted. Psychiatric: Mood and affect are normal. Speech and behavior are normal.  ____________________________________________   LABS (  all labs ordered are listed, but only abnormal results are displayed)  Labs Reviewed  BASIC METABOLIC PANEL - Abnormal; Notable for the following:       Result Value   Glucose, Bld 106 (*)    BUN 21 (*)    All other components within normal limits  URINALYSIS, ROUTINE W REFLEX MICROSCOPIC    CBC WITH DIFFERENTIAL/PLATELET   ____________________________________________  RADIOLOGY  Ct Renal Stone Study  Result Date: 04/12/2016 CLINICAL DATA:  Left-sided flank pain radiates to left groin. Nausea and vomiting. EXAM: CT ABDOMEN AND PELVIS WITHOUT CONTRAST TECHNIQUE: Multidetector CT imaging of the abdomen and pelvis was performed following the standard protocol without IV contrast. COMPARISON:  10/08/2015 FINDINGS: Lower chest:  Compressive atelectasis lung bases. Hepatobiliary: No focal abnormality within the liver parenchyma. 6 mm hypodensity lateral segment unchanged, likely cyst. There is no evidence for gallstones, gallbladder wall thickening, or pericholecystic fluid. No intrahepatic or extrahepatic biliary dilation. Pancreas: No focal mass lesion. No dilatation of the main duct. No intraparenchymal cyst. No peripancreatic edema. Spleen: No splenomegaly. No focal mass lesion. Adrenals/Urinary Tract: No adrenal nodule or mass. No right renal or ureteral stones. No secondary changes in the right kidney or ureter. 5 x 5 x 7 mm nonobstructing stone identified lower pole left kidney no left ureteral stone. Tiny 1-2 mm left upper pole stone best seen on coronal image 103 of series 5. No secondary changes left kidney or ureter. No bladder stones. Stomach/Bowel: Stomach is nondistended. No gastric wall thickening. No evidence of outlet obstruction. Duodenum is normally positioned as is the ligament of Treitz. No small bowel wall thickening. No small bowel dilatation. The terminal ileum is normal. The appendix is normal. Diverticular changes are noted in the left colon without evidence of diverticulitis. Vascular/Lymphatic: There is abdominal aortic atherosclerosis without aneurysm. There is no gastrohepatic or hepatoduodenal ligament lymphadenopathy. No intraperitoneal or retroperitoneal lymphadenopathy. No pelvic sidewall lymphadenopathy. Reproductive: The uterus has normal CT imaging appearance.  There is no adnexal mass. Other: No intraperitoneal free fluid. Musculoskeletal: Bone windows reveal no worrisome lytic or sclerotic osseous lesions. IMPRESSION: 1. 7 mm and 2 mm nonobstructing left renal stones. No secondary changes in either kidney or ureter. 2. Otherwise no acute findings in the abdomen or pelvis. Electronically Signed   By: Kennith CenterEric  Mansell M.D.   On: 04/12/2016 15:49    ____________________________________________   PROCEDURES  Procedure(s) performed:   Procedures  None ____________________________________________   INITIAL IMPRESSION / ASSESSMENT AND PLAN / ED COURSE  Pertinent labs & imaging results that were available during my care of the patient were reviewed by me and considered in my medical decision making (see chart for details).  Patient with PMH of kidney stone presents with acute onset left flank pain typical of prior stones. No fever, chills, or evidence of acute infection. Has required Urology intervention in the past. Plan for CT renal protocol given recent need for intervention and acute symptoms.   04:10 PM Patient with NO stone visualized in the left ureter. No have intra-renal stones. No secondary signs of obstructing process. Updated patient and he reports this feels exactly like kidney stones in the past. I suppose she could have a radiolucent, nonobstructing stone that is causing some intermittent discomfort. The remainder of the CT is unremarkable. She has no clear evidence of diverticulitis. Aorta is normal caliber. No other findings on CT to increase my suspicion for acute intra-abdominal process. Urinalysis pending. No leukocytosis. Plan for pain control at home. She'll follow-up  with urology as an outpatient for possible passed stone or small radiolucent stone.   At this time, I do not feel there is any life-threatening condition present. I have reviewed and discussed all results (EKG, imaging, lab, urine as appropriate), exam findings with  patient. I have reviewed nursing notes and appropriate previous records.  I feel the patient is safe to be discharged home without further emergent workup. Discussed usual and customary return precautions. Patient and family (if present) verbalize understanding and are comfortable with this plan.  Patient will follow-up with their primary care provider. If they do not have a primary care provider, information for follow-up has been provided to them. All questions have been answered.  ____________________________________________  FINAL CLINICAL IMPRESSION(S) / ED DIAGNOSES  Final diagnoses:  Left flank pain     MEDICATIONS GIVEN DURING THIS VISIT:  Medications  ketorolac (TORADOL) 30 MG/ML injection 30 mg (30 mg Intravenous Given 04/12/16 1454)  HYDROmorphone (DILAUDID) injection 1 mg (1 mg Intravenous Given 04/12/16 1501)  HYDROmorphone (DILAUDID) injection 1 mg (1 mg Intravenous Given 04/12/16 1600)  oxyCODONE-acetaminophen (PERCOCET/ROXICET) 5-325 MG per tablet 1 tablet (1 tablet Oral Given 04/12/16 1749)     NEW OUTPATIENT MEDICATIONS STARTED DURING THIS VISIT:  Discharge Medication List as of 04/12/2016  4:52 PM    START taking these medications   Details  ondansetron (ZOFRAN ODT) 4 MG disintegrating tablet Take 1 tablet (4 mg total) by mouth every 8 (eight) hours as needed for nausea or vomiting., Starting Thu 04/12/2016, Print    oxyCODONE-acetaminophen (PERCOCET/ROXICET) 5-325 MG tablet Take 1 tablet by mouth every 6 (six) hours as needed for severe pain., Starting Thu 04/12/2016, Print    tamsulosin (FLOMAX) 0.4 MG CAPS capsule Take 1 capsule (0.4 mg total) by mouth daily., Starting Thu 04/12/2016, Print        Note:  This document was prepared using Dragon voice recognition software and may include unintentional dictation errors.  Alona Bene, MD Emergency Medicine   Maia Plan, MD 04/12/16 [phone]

## 2016-09-13 ENCOUNTER — Ambulatory Visit: Payer: Managed Care, Other (non HMO) | Admitting: Sports Medicine

## 2016-09-13 ENCOUNTER — Ambulatory Visit: Payer: Self-pay | Admitting: Orthopedic Surgery

## 2016-09-13 NOTE — H&P (Signed)
Elizabeth LewandowskyMarsha Prince is an 56 y.o. female.   Chief Complaint: L knee pain, instability HPI: The patient is a 56 year old female who presents today for follow up of their knee. The patient is being followed for their left knee pain. They are now year(s) out from when symptoms began. Symptoms reported today include: pain. The patient feels that they are doing poorly. Current treatment includes: bracing, activity modification and NSAIDs. The following medication has been used for pain control: antiinflammatory medication. The patient presents today following MRI.  Note:the patient follows up with her daughter and her MRI report.  She still reports pain on the lateral aspect of the knee that something seems like it dislocates. She will getout of a low seat or a low car not specifically the kneecap hours she feels like there is a bone that comes out. will have the locking she has to move or turn her leg to a certain size to unlock side to unlock it. no previous trauma. She did play a lot of racquetball earlier in her life. She works at a desk all day.  Past Medical History:  Diagnosis Date  . Bipolar 1 disorder (HCC)   . CAD (coronary artery disease)   . Myocardial infarction     Past Surgical History:  Procedure Laterality Date  . CYSTOSCOPY/RETROGRADE/URETEROSCOPY/STONE EXTRACTION WITH BASKET Right 10/08/2015   Procedure: CYSTOSCOPY/RETROGRADE/URETEROSCOPY/STONE EXTRACTION WITH BASKET LASER;  Surgeon: Sebastian Acheheodore Manny, MD;  Location: [facility];  Service: Urology;  Laterality: Right;  . KIDNEY STONE SURGERY      No family history on file. Social History:  reports that she has never smoked. She has never used smokeless tobacco. She reports that she does not drink alcohol or use drugs.  Allergies: No Known Allergies   (Not in a hospital admission)  No results found for this or any previous visit (from the past 48 hour(s)). No results found.  Review of Systems  Constitutional: Negative.   HENT:  Negative.   Eyes: Negative.   Respiratory: Negative.   Cardiovascular: Negative.   Gastrointestinal: Negative.   Genitourinary: Negative.   Musculoskeletal: Positive for joint pain.  Skin: Negative.   Neurological: Negative.   Psychiatric/Behavioral: Negative.     There were no vitals taken for this visit. Physical Exam  Constitutional: She is oriented to person, place, and time. She appears well-developed and well-nourished.  HENT:  Head: Normocephalic.  Eyes: Pupils are equal, round, and reactive to light.  Neck: Normal range of motion.  Cardiovascular: Normal rate.   Respiratory: Effort normal.  GI: Soft.  Musculoskeletal:  The physical exam findings are as follows: Note:mild distress. Walks with an antalgic gait. Tender and lateral joint line. Equivocal Mc[place]ray. Unable to dislocate or sublux the patella with quadricep relaxation. No apprehension sign. The tibiofibular joint is stable. Trace effusion. No DVT. Ipsilateral hip and ankle exam is unremarkable. No instability. Negative anterior drawer.  Neurological: She is alert and oriented to person, place, and time.     MRI report demonstrates normal alignment of the patella without evidence for previous dislocation. There is circumferential tearing of the lateral meniscus and a small displaced meniscal fragment adjacent to the body. 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 ACL and PCL is unremarkable.   Assessment/Plan L knee LMT  I had an extensive discussion concerning her pathology and relevant antomy. I do feel based upon the MRI result and her history and exam that  is she is symptomatic from a lateral meniscus tear. As well as a severe chondromalacia of the tibial plateau. It is unclear as to the etiology of this pathology as she has no specific trauma event. She did play a lot of racquetball bending twisting and lateral motion however. Her  radiographs show a well-maintained space at the tibiofibular joint. And give it and given that she has failed conservative treatment including physical therapy and injection one option at this point other than living with her symptoms and activity modification would be to proceed with a knee arthroscopy. Partial lateral meniscectomy and debridement. She is aware that this may not change her symptoms may make her symptoms worse or eventually she may require a knee replacement given the grade 4 changes in the lateral compartment. However hopefully with trimming of the meniscus and debris Mott her mechanical symptoms can be minimized. I do feel that she will require avoiding weightbearing on the knee when the knee is flexed to 90 or beyond and at. We discussed higher chairs higher car to get in and out of etc.  The surgery involves outpatient. One day of crutches. No history of DVT or pulmonary embolism or stroke or heart attack or chest pain or shortness of breath.  She does not have a medical doctor. We discussed a we will obtain a an EKG and some baseline blood chemistries.  sutures out in 2 weeks. Maximum medical improvement in 6 weeks. We also discussed Visco supplementation and ongoing symptoms as we cannot return cartilage that has been more she understands. And would like to proceed.  Plan L knee arthroscopy, partial lateral meniscectomy, debridement  BISSELL, [name], PA-C 09/13/2016, 9:46 AM

## 2016-09-14 NOTE — Patient Instructions (Signed)
Elizabeth Prince  09/14/2016   Your procedure is scheduled on:  09/20/16  Report to [facility] Main  Entrance Take Black  elevators to 3rd floor to  Short Stay Center at    0830 AM.    Call this number if you have problems the morning of surgery [phone]    Remember: ONLY 1 PERSON MAY GO WITH YOU TO SHORT STAY TO GET  READY MORNING OF YOUR SURGERY.  Do not eat food or drink liquids :After Midnight.     Take these medicines the morning of surgery with A SIP OF WATER: xanax if needed                                You may not have any metal on your body including hair pins and              piercings  Do not wear jewelry, make-up, lotions, powders or perfumes, deodorant             Do not wear nail polish.  Do not shave  48 hours prior to surgery.     Do not bring valuables to the hospital. [place] IS NOT             RESPONSIBLE   FOR VALUABLES.  Contacts, dentures or bridgework may not be worn into surgery.      Patients discharged the day of surgery will not be allowed to drive home.  Name and phone number of your driver:  Special Instructions: N/A              Please read over the following fact sheets you were given: _____________________________________________________________________           [facility] - Preparing for Surgery Before surgery, you can play an important role.  Because skin is not sterile, your skin needs to be as free of germs as possible.  You can reduce the number of germs on your skin by washing with CHG (chlorahexidine gluconate) soap before surgery.  CHG is an antiseptic cleaner which kills germs and bonds with the skin to continue killing germs even after washing. Please DO NOT use if you have an allergy to CHG or antibacterial soaps.  If your skin becomes reddened/irritated stop using the CHG and inform your nurse when you arrive at Short Stay. Do not shave (including legs and underarms) for at least 48 hours prior to the  first CHG shower.  You may shave your face/neck. Please follow these instructions carefully:  1.  Shower with CHG Soap the night before surgery and the  morning of Surgery.  2.  If you choose to wash your hair, wash your hair first as usual with your  normal  shampoo.  3.  After you shampoo, rinse your hair and body thoroughly to remove the  shampoo.                           4.  Use CHG as you would any other liquid soap.  You can apply chg directly  to the skin and wash                       Gently with a scrungie or clean washcloth.  5.  Apply the CHG Soap  to your body ONLY FROM THE NECK DOWN.   Do not use on face/ open                           Wound or open sores. Avoid contact with eyes, ears mouth and genitals (private parts).                       Wash face,  Genitals (private parts) with your normal soap.             6.  Wash thoroughly, paying special attention to the area where your surgery  will be performed.  7.  Thoroughly rinse your body with warm water from the neck down.  8.  DO NOT shower/wash with your normal soap after using and rinsing off  the CHG Soap.                9.  Pat yourself dry with a clean towel.            10.  Wear clean pajamas.            11.  Place clean sheets on your bed the night of your first shower and do not  sleep with pets. Day of Surgery : Do not apply any lotions/deodorants the morning of surgery.  Please wear clean clothes to the hospital/surgery center.  FAILURE TO FOLLOW THESE INSTRUCTIONS MAY RESULT IN THE CANCELLATION OF YOUR SURGERY PATIENT SIGNATURE_________________________________  NURSE SIGNATURE__________________________________  ________________________________________________________________________   Elizabeth MireIncentive Spirometer  An incentive spirometer is a tool that can help keep your lungs clear and active. This tool measures how well you are filling your lungs with each breath. Taking long deep breaths may help reverse or decrease  the chance of developing breathing (pulmonary) problems (especially infection) following:  A long period of time when you are unable to move or be active. BEFORE THE PROCEDURE   If the spirometer includes an indicator to show your best effort, your nurse or respiratory therapist will set it to a desired goal.  If possible, sit up straight or lean slightly forward. Try not to slouch.  Hold the incentive spirometer in an upright position. INSTRUCTIONS FOR USE  1. Sit on the edge of your bed if possible, or sit up as far as you can in bed or on a chair. 2. Hold the incentive spirometer in an upright position. 3. Breathe out normally. 4. Place the mouthpiece in your mouth and seal your lips tightly around it. 5. Breathe in slowly and as deeply as possible, raising the piston or the ball toward the top of the column. 6. Hold your breath for 3-5 seconds or for as long as possible. Allow the piston or ball to fall to the bottom of the column. 7. Remove the mouthpiece from your mouth and breathe out normally. 8. Rest for a few seconds and repeat Steps 1 through 7 at least 10 times every 1-2 hours when you are awake. Take your time and take a few normal breaths between deep breaths. 9. The spirometer may include an indicator to show your best effort. Use the indicator as a goal to work toward during each repetition. 10. After each set of 10 deep breaths, practice coughing to be sure your lungs are clear. If you have an incision (the cut made at the time of surgery), support your incision when coughing by placing a pillow or rolled up towels firmly  against it. Once you are able to get out of bed, walk around indoors and cough well. You may stop using the incentive spirometer when instructed by your caregiver.  RISKS AND COMPLICATIONS  Take your time so you do not get dizzy or light-headed.  If you are in pain, you may need to take or ask for pain medication before doing incentive spirometry. It is  harder to take a deep breath if you are having pain. AFTER USE  Rest and breathe slowly and easily.  It can be helpful to keep track of a log of your progress. Your caregiver can provide you with a simple table to help with this. If you are using the spirometer at home, follow these instructions: SEEK MEDICAL CARE IF:   You are having difficultly using the spirometer.  You have trouble using the spirometer as often as instructed.  Your pain medication is not giving enough relief while using the spirometer.  You develop fever of 100.5 F (38.1 C) or higher. SEEK IMMEDIATE MEDICAL CARE IF:   You cough up bloody sputum that had not been present before.  You develop fever of 102 F (38.9 C) or greater.  You develop worsening pain at or near the incision site. MAKE SURE YOU:   Understand these instructions.  Will watch your condition.  Will get help right away if you are not doing well or get worse. Document Released: 05/14/2006 Document Revised: 03/26/2011 Document Reviewed: 07/15/2006 [facility] Patient Information 2014 [place], [place].   ________________________________________________________________________

## 2016-09-19 ENCOUNTER — Encounter (HOSPITAL_COMMUNITY)
Admission: RE | Admit: 2016-09-19 | Discharge: 2016-09-19 | Disposition: A | Discharge status: Home / Self Care | Payer: Commercial Managed Care - PPO | Source: Ambulatory Visit | Attending: Specialist | Admitting: Specialist

## 2016-09-19 ENCOUNTER — Encounter (HOSPITAL_COMMUNITY): Payer: Self-pay

## 2016-09-19 DIAGNOSIS — M25362 Other instability, left knee: Secondary | ICD-10-CM | POA: Diagnosis not present

## 2016-09-19 DIAGNOSIS — Z885 Allergy status to narcotic agent status: Secondary | ICD-10-CM | POA: Diagnosis not present

## 2016-09-19 DIAGNOSIS — I2510 Atherosclerotic heart disease of native coronary artery without angina pectoris: Secondary | ICD-10-CM | POA: Diagnosis not present

## 2016-09-19 DIAGNOSIS — I252 Old myocardial infarction: Secondary | ICD-10-CM | POA: Diagnosis not present

## 2016-09-19 DIAGNOSIS — M23232 Derangement of other medial meniscus due to old tear or injury, left knee: Secondary | ICD-10-CM | POA: Diagnosis not present

## 2016-09-19 DIAGNOSIS — F319 Bipolar disorder, unspecified: Secondary | ICD-10-CM | POA: Diagnosis not present

## 2016-09-19 DIAGNOSIS — M23201 Derangement of unspecified lateral meniscus due to old tear or injury, left knee: Secondary | ICD-10-CM | POA: Diagnosis present

## 2016-09-19 DIAGNOSIS — M2242 Chondromalacia patellae, left knee: Secondary | ICD-10-CM | POA: Diagnosis not present

## 2016-09-19 DIAGNOSIS — Z79899 Other long term (current) drug therapy: Secondary | ICD-10-CM | POA: Diagnosis not present

## 2016-09-19 DIAGNOSIS — M23262 Derangement of other lateral meniscus due to old tear or injury, left knee: Secondary | ICD-10-CM | POA: Diagnosis not present

## 2016-09-19 HISTORY — DX: Anxiety disorder, unspecified: F41.9

## 2016-09-19 HISTORY — DX: Personal history of urinary calculi: Z87.442

## 2016-09-19 HISTORY — DX: Unspecified osteoarthritis, unspecified site: M19.90

## 2016-09-19 LAB — CBC
HCT: 44.5 % (ref 36.0–46.0)
HEMOGLOBIN: 15.2 g/dL — AB (ref 12.0–15.0)
MCH: 28.2 pg (ref 26.0–34.0)
MCHC: 34.2 g/dL (ref 30.0–36.0)
MCV: 82.6 fL (ref 78.0–100.0)
PLATELETS: 189 10*3/uL (ref 150–400)
RBC: 5.39 MIL/uL — ABNORMAL HIGH (ref 3.87–5.11)
RDW: 13.3 % (ref 11.5–15.5)
WBC: 6.6 10*3/uL (ref 4.0–10.5)

## 2016-09-19 LAB — BASIC METABOLIC PANEL
ANION GAP: 9 (ref 5–15)
BUN: 14 mg/dL (ref 6–20)
CALCIUM: 9.8 mg/dL (ref 8.9–10.3)
CO2: 27 mmol/L (ref 22–32)
CREATININE: 1.01 mg/dL — AB (ref 0.44–1.00)
Chloride: 102 mmol/L (ref 101–111)
GLUCOSE: 103 mg/dL — AB (ref 65–99)
Potassium: 4.5 mmol/L (ref 3.5–5.1)
Sodium: 138 mmol/L (ref 135–145)

## 2016-09-20 ENCOUNTER — Encounter (HOSPITAL_COMMUNITY)
Admission: RE | Disposition: A | Discharge status: Home / Self Care | Payer: Self-pay | Source: Ambulatory Visit | Attending: Specialist

## 2016-09-20 ENCOUNTER — Ambulatory Visit (HOSPITAL_COMMUNITY): Payer: Commercial Managed Care - PPO | Admitting: Anesthesiology

## 2016-09-20 ENCOUNTER — Encounter (HOSPITAL_COMMUNITY): Payer: Self-pay | Admitting: *Deleted

## 2016-09-20 ENCOUNTER — Ambulatory Visit (HOSPITAL_COMMUNITY)
Admission: RE | Admit: 2016-09-20 | Discharge: 2016-09-20 | Disposition: A | Discharge status: Home / Self Care | Payer: Commercial Managed Care - PPO | Source: Ambulatory Visit | Attending: Specialist | Admitting: Specialist

## 2016-09-20 DIAGNOSIS — I252 Old myocardial infarction: Secondary | ICD-10-CM | POA: Insufficient documentation

## 2016-09-20 DIAGNOSIS — I2510 Atherosclerotic heart disease of native coronary artery without angina pectoris: Secondary | ICD-10-CM | POA: Insufficient documentation

## 2016-09-20 DIAGNOSIS — M23232 Derangement of other medial meniscus due to old tear or injury, left knee: Secondary | ICD-10-CM | POA: Insufficient documentation

## 2016-09-20 DIAGNOSIS — M23262 Derangement of other lateral meniscus due to old tear or injury, left knee: Secondary | ICD-10-CM | POA: Insufficient documentation

## 2016-09-20 DIAGNOSIS — S83105A Unspecified dislocation of left knee, initial encounter: Secondary | ICD-10-CM

## 2016-09-20 DIAGNOSIS — S83282A Other tear of lateral meniscus, current injury, left knee, initial encounter: Secondary | ICD-10-CM

## 2016-09-20 DIAGNOSIS — Z79899 Other long term (current) drug therapy: Secondary | ICD-10-CM | POA: Insufficient documentation

## 2016-09-20 DIAGNOSIS — M2242 Chondromalacia patellae, left knee: Secondary | ICD-10-CM | POA: Insufficient documentation

## 2016-09-20 DIAGNOSIS — Z885 Allergy status to narcotic agent status: Secondary | ICD-10-CM | POA: Insufficient documentation

## 2016-09-20 DIAGNOSIS — F319 Bipolar disorder, unspecified: Secondary | ICD-10-CM | POA: Insufficient documentation

## 2016-09-20 DIAGNOSIS — N201 Calculus of ureter: Secondary | ICD-10-CM

## 2016-09-20 DIAGNOSIS — M25362 Other instability, left knee: Secondary | ICD-10-CM | POA: Insufficient documentation

## 2016-09-20 HISTORY — PX: KNEE ARTHROSCOPY WITH LATERAL MENISECTOMY: SHX6193

## 2016-09-20 SURGERY — ARTHROSCOPY, KNEE, WITH LATERAL MENISCECTOMY
Anesthesia: General | Site: Knee | Laterality: Left

## 2016-09-20 MED ORDER — LACTATED RINGERS IR SOLN
Status: DC | PRN
  Administered 2016-09-20: 6000 mL

## 2016-09-20 MED ORDER — EPINEPHRINE PF 1 MG/ML IJ SOLN
INTRAMUSCULAR | Status: DC | PRN
  Administered 2016-09-20: 2 mg

## 2016-09-20 MED ORDER — CHLORHEXIDINE GLUCONATE 4 % EX LIQD: 60.0000 mL | Freq: Once | CUTANEOUS | Status: DC

## 2016-09-20 MED ORDER — EPINEPHRINE PF 1 MG/ML IJ SOLN
INTRAMUSCULAR | Status: AC
  Filled 2016-09-20: qty 2

## 2016-09-20 MED ORDER — LACTATED RINGERS IV SOLN
INTRAVENOUS | Status: DC
  Administered 2016-09-20: 09:00:00 via INTRAVENOUS

## 2016-09-20 MED ORDER — DEXAMETHASONE SODIUM PHOSPHATE 10 MG/ML IJ SOLN
INTRAMUSCULAR | Status: DC | PRN
  Administered 2016-09-20: 10 mg via INTRAVENOUS

## 2016-09-20 MED ORDER — FENTANYL CITRATE (PF) 100 MCG/2ML IJ SOLN: 25.0000 ug | INTRAMUSCULAR | Status: DC | PRN

## 2016-09-20 MED ORDER — FENTANYL CITRATE (PF) 250 MCG/5ML IJ SOLN
INTRAMUSCULAR | Status: AC
  Filled 2016-09-20: qty 5

## 2016-09-20 MED ORDER — MIDAZOLAM HCL 5 MG/5ML IJ SOLN
INTRAMUSCULAR | Status: DC | PRN
  Administered 2016-09-20: 2 mg via INTRAVENOUS

## 2016-09-20 MED ORDER — PROPOFOL 10 MG/ML IV BOLUS
INTRAVENOUS | Status: AC
  Filled 2016-09-20: qty 20

## 2016-09-20 MED ORDER — OXYCODONE-ACETAMINOPHEN 5-325 MG PO TABS
1.0000 | ORAL_TABLET | ORAL | Status: DC | PRN
  Administered 2016-09-20: 2 via ORAL
  Filled 2016-09-20: qty 2

## 2016-09-20 MED ORDER — BUPIVACAINE-EPINEPHRINE (PF) 0.5% -1:200000 IJ SOLN
INTRAMUSCULAR | Status: AC
  Filled 2016-09-20: qty 30

## 2016-09-20 MED ORDER — ASPIRIN EC 325 MG PO TBEC
325.0000 mg | DELAYED_RELEASE_TABLET | Freq: Every day | ORAL | 1 refills | Status: AC
Start: 2016-09-20 — End: 2016-09-21

## 2016-09-20 MED ORDER — DEXAMETHASONE SODIUM PHOSPHATE 10 MG/ML IJ SOLN
INTRAMUSCULAR | Status: AC
  Filled 2016-09-20: qty 1

## 2016-09-20 MED ORDER — LIDOCAINE HCL (CARDIAC) 20 MG/ML IV SOLN
INTRAVENOUS | Status: DC | PRN
  Administered 2016-09-20: 50 mg via INTRAVENOUS

## 2016-09-20 MED ORDER — MIDAZOLAM HCL 2 MG/2ML IJ SOLN
INTRAMUSCULAR | Status: AC
  Filled 2016-09-20: qty 2

## 2016-09-20 MED ORDER — ONDANSETRON HCL 4 MG/2ML IJ SOLN
4.0000 mg | Freq: Once | INTRAMUSCULAR | Status: AC | PRN
  Administered 2016-09-20: 4 mg via INTRAVENOUS
  Filled 2016-09-20: qty 2

## 2016-09-20 MED ORDER — ROCURONIUM BROMIDE 50 MG/5ML IV SOSY
PREFILLED_SYRINGE | INTRAVENOUS | Status: AC
  Filled 2016-09-20: qty 5

## 2016-09-20 MED ORDER — LIDOCAINE 2% (20 MG/ML) 5 ML SYRINGE
INTRAMUSCULAR | Status: AC
  Filled 2016-09-20: qty 5

## 2016-09-20 MED ORDER — BUPIVACAINE-EPINEPHRINE 0.5% -1:200000 IJ SOLN
INTRAMUSCULAR | Status: DC | PRN
  Administered 2016-09-20: 25 mL

## 2016-09-20 MED ORDER — STERILE WATER FOR IRRIGATION IR SOLN
Status: DC | PRN
  Administered 2016-09-20: 500 mL

## 2016-09-20 MED ORDER — OXYCODONE-ACETAMINOPHEN 5-325 MG PO TABS: 1.0000 | ORAL_TABLET | ORAL | 0 refills | Status: DC | PRN

## 2016-09-20 MED ORDER — OXYCODONE-ACETAMINOPHEN 5-325 MG PO TABS: 1.0000 | ORAL_TABLET | ORAL | Status: DC | PRN

## 2016-09-20 MED ORDER — ONDANSETRON HCL 4 MG/2ML IJ SOLN
INTRAMUSCULAR | Status: AC
  Filled 2016-09-20: qty 2

## 2016-09-20 MED ORDER — ASPIRIN EC 325 MG PO TBEC: 325.0000 mg | DELAYED_RELEASE_TABLET | Freq: Every day | ORAL | Status: DC

## 2016-09-20 MED ORDER — CEFAZOLIN SODIUM-DEXTROSE 2-4 GM/100ML-% IV SOLN
2.0000 g | INTRAVENOUS | Status: AC
  Administered 2016-09-20: 2 g via INTRAVENOUS
  Filled 2016-09-20: qty 100

## 2016-09-20 MED ORDER — FENTANYL CITRATE (PF) 100 MCG/2ML IJ SOLN
INTRAMUSCULAR | Status: DC | PRN
  Administered 2016-09-20: 100 ug via INTRAVENOUS
  Administered 2016-09-20 (×2): 50 ug via INTRAVENOUS

## 2016-09-20 MED ORDER — PROPOFOL 10 MG/ML IV BOLUS
INTRAVENOUS | Status: DC | PRN
  Administered 2016-09-20: 200 mg via INTRAVENOUS

## 2016-09-20 SURGICAL SUPPLY — 30 items
BANDAGE ACE 6X5 VEL STRL LF (GAUZE/BANDAGES/DRESSINGS) ×2 IMPLANT
BLADE 4.2CUDA (BLADE) IMPLANT
BLADE CUDA SHAVER 3.5 (BLADE) ×4 IMPLANT
BLADE SURG SZ11 CARB STEEL (BLADE) IMPLANT
BOOTIES KNEE HIGH SLOAN (MISCELLANEOUS) ×4 IMPLANT
CLOTH 2% CHLOROHEXIDINE 3PK (PERSONAL CARE ITEMS) ×2 IMPLANT
COVER SURGICAL LIGHT HANDLE (MISCELLANEOUS) ×2 IMPLANT
DRSG EMULSION OIL 3X3 NADH (GAUZE/BANDAGES/DRESSINGS) ×2 IMPLANT
DRSG PAD ABDOMINAL 8X10 ST (GAUZE/BANDAGES/DRESSINGS) IMPLANT
DURAPREP 26ML APPLICATOR (WOUND CARE) ×2 IMPLANT
GAUZE SPONGE 4X4 12PLY STRL (GAUZE/BANDAGES/DRESSINGS) ×2 IMPLANT
GLOVE BIOGEL PI IND STRL 7.0 (GLOVE) ×1 IMPLANT
GLOVE BIOGEL PI IND STRL 8 (GLOVE) ×1 IMPLANT
GLOVE BIOGEL PI INDICATOR 7.0 (GLOVE) ×1
GLOVE BIOGEL PI INDICATOR 8 (GLOVE) ×1
GLOVE SURG SS PI 7.0 STRL IVOR (GLOVE) ×2 IMPLANT
GLOVE SURG SS PI 7.5 STRL IVOR (GLOVE) ×2 IMPLANT
GLOVE SURG SS PI 8.0 STRL IVOR (GLOVE) ×4 IMPLANT
GOWN STRL REUS W/TWL LRG LVL3 (GOWN DISPOSABLE) ×2 IMPLANT
GOWN STRL REUS W/TWL XL LVL3 (GOWN DISPOSABLE) ×4 IMPLANT
KIT BASIN OR (CUSTOM PROCEDURE TRAY) IMPLANT
MANIFOLD NEPTUNE II (INSTRUMENTS) ×4 IMPLANT
PACK ARTHROSCOPY WL (CUSTOM PROCEDURE TRAY) ×2 IMPLANT
PAD ABD 8X10 STRL (GAUZE/BANDAGES/DRESSINGS) ×2 IMPLANT
PADDING CAST COTTON 6X4 STRL (CAST SUPPLIES) ×2 IMPLANT
SUT ETHILON 4 0 PS 2 18 (SUTURE) ×2 IMPLANT
TOWEL OR 17X26 10 PK STRL BLUE (TOWEL DISPOSABLE) ×2 IMPLANT
TUBING ARTHRO INFLOW-ONLY STRL (TUBING) ×2 IMPLANT
WAND HAND CNTRL MULTIVAC 50 (MISCELLANEOUS) ×2 IMPLANT
WRAP KNEE MAXI GEL POST OP (GAUZE/BANDAGES/DRESSINGS) ×2 IMPLANT

## 2016-09-20 NOTE — Transfer of Care (Signed)
Immediate Anesthesia Transfer of Care Note  Patient: Elizabeth LewandowskyMarsha Steege  Procedure(s) Performed: Procedure(s) with comments: Left knee arthroscopic partial medial and lateral menisectomy and debridement (Left) - 60 mins  Patient Location: PACU  Anesthesia Type:General  Level of Consciousness: awake, alert  and oriented  Airway & Oxygen Therapy: Patient Spontanous Breathing and Patient connected to face mask oxygen  Post-op Assessment: Report given to RN and Post -op Vital signs reviewed and stable  Post vital signs: Reviewed and stable  Last Vitals:  Vitals:   09/20/16 0833  BP: 139/88  Pulse: 97  Resp: 16  Temp: 36.8 C  SpO2: 98%    Last Pain:  Vitals:   09/20/16 0849  TempSrc:   PainSc: 4       Patients Stated Pain Goal: 3 (09/20/16 0849)  Complications: No apparent anesthesia complications

## 2016-09-20 NOTE — Brief Op Note (Signed)
09/20/2016  11:12 AM  PATIENT:  Elizabeth LewandowskyMarsha Prince  56 y.o. female  PRE-OPERATIVE DIAGNOSIS:  Lateral meniscus tear left knee  POST-OPERATIVE DIAGNOSIS:  Lateral meniscus tear left knee  PROCEDURE:  Procedure(s) with comments: Left knee arthroscopic partial medial and lateral menisectomy and debridement (Left) - 60 mins  SURGEON:  Surgeon(s) and Role:    [name]* Saddie Sandeen, MD - Primary  PHYSICIAN ASSISTANT:   ASSISTANTS: Bissell   ANESTHESIA:   general  EBL:  No intake/output data recorded.  BLOOD ADMINISTERED:none  DRAINS: none   LOCAL MEDICATIONS USED:  MARCAINE     SPECIMEN:  No Specimen  DISPOSITION OF SPECIMEN:  N/A  COUNTS:  YES  TOURNIQUET:  * No tourniquets in log *  DICTATION: .Other Dictation: Dictation Number [id number]  PLAN OF CARE: Admit for overnight observation   PATIENT DISPOSITION:  PACU - hemodynamically stable.   Delay start of Pharmacological VTE agent (>24hrs) due to surgical blood loss or risk of bleeding: no

## 2016-09-20 NOTE — Discharge Instructions (Signed)

## 2016-09-20 NOTE — Anesthesia Preprocedure Evaluation (Addendum)
Anesthesia Evaluation  Patient identified by MRN, date of birth, ID band Patient awake    Reviewed: Allergy & Precautions, NPO status , Patient's Chart, lab work & pertinent test results  Airway Mallampati: II  TM Distance: >3 FB Neck ROM: Full    Dental  (+) Teeth Intact, Dental Advisory Given   Pulmonary    breath sounds clear to auscultation       Cardiovascular  Rhythm:Regular Rate:Normal     Neuro/Psych    GI/Hepatic   Endo/Other    Renal/GU      Musculoskeletal   Abdominal   Peds  Hematology   Anesthesia Other Findings   Reproductive/Obstetrics                             Anesthesia Physical Anesthesia Plan  ASA: III  Anesthesia Plan: General   Post-op Pain Management:    Induction: Intravenous  PONV Risk Score and Plan: Ondansetron and Dexamethasone  Airway Management Planned: LMA  Additional Equipment:   Intra-op Plan:   Post-operative Plan:   Informed Consent: I have reviewed the patients History and Physical, chart, labs and discussed the procedure including the risks, benefits and alternatives for the proposed anesthesia with the patient or authorized representative who has indicated his/her understanding and acceptance.     Dental advisory given  Plan Discussed with: CRNA and Anesthesiologist  Anesthesia Plan Comments:         Anesthesia Quick Evaluation  

## 2016-09-20 NOTE — H&P (View-Only) (Signed)
Elizabeth LewandowskyMarsha Prince is an [age] y.o. female.   Chief Complaint: L knee pain, instability HPI: The patient is a 56 year old female who presents today for follow up of their knee. The patient is being followed for their left knee pain. They are now year(s) out from when symptoms began. Symptoms reported today include: pain. The patient feels that they are doing poorly. Current treatment includes: bracing, activity modification and NSAIDs. The following medication has been used for pain control: antiinflammatory medication. The patient presents today following MRI.  Note:the patient follows up with her daughter and her MRI report.  She still reports pain on the lateral aspect of the knee that something seems like it dislocates. She will getout of a low seat or a low car not specifically the kneecap hours she feels like there is a bone that comes out. will have the locking she has to move or turn her leg to a certain size to unlock side to unlock it. no previous trauma. She did play a lot of racquetball earlier in her life. She works at a desk all day.  Past Medical History:  Diagnosis Date  . Bipolar 1 disorder (HCC)   . CAD (coronary artery disease)   . Myocardial infarction     Past Surgical History:  Procedure Laterality Date  . CYSTOSCOPY/RETROGRADE/URETEROSCOPY/STONE EXTRACTION WITH BASKET Right 10/08/2015   Procedure: CYSTOSCOPY/RETROGRADE/URETEROSCOPY/STONE EXTRACTION WITH BASKET LASER;  Surgeon: Sebastian Acheheodore Manny, MD;  Location: [facility];  Service: Urology;  Laterality: Right;  . KIDNEY STONE SURGERY      No family history on file. Social History:  reports that she has never smoked. She has never used smokeless tobacco. She reports that she does not drink alcohol or use drugs.  Allergies: No Known Allergies   (Not in a hospital admission)  No results found for this or any previous visit (from the past 48 hour(s)). No results found.  Review of Systems  Constitutional: Negative.   HENT:  Negative.   Eyes: Negative.   Respiratory: Negative.   Cardiovascular: Negative.   Gastrointestinal: Negative.   Genitourinary: Negative.   Musculoskeletal: Positive for joint pain.  Skin: Negative.   Neurological: Negative.   Psychiatric/Behavioral: Negative.     There were no vitals taken for this visit. Physical Exam  Constitutional: She is oriented to person, place, and time. She appears well-developed and well-nourished.  HENT:  Head: Normocephalic.  Eyes: Pupils are equal, round, and reactive to light.  Neck: Normal range of motion.  Cardiovascular: Normal rate.   Respiratory: Effort normal.  GI: Soft.  Musculoskeletal:  The physical exam findings are as follows: Note:mild distress. Walks with an antalgic gait. Tender and lateral joint line. Equivocal McMurray. Unable to dislocate or sublux the patella with quadricep relaxation. No apprehension sign. The tibiofibular joint is stable. Trace effusion. No DVT. Ipsilateral hip and ankle exam is unremarkable. No instability. Negative anterior drawer.  Neurological: She is alert and oriented to person, place, and time.     MRI report demonstrates normal alignment of the patella without evidence for previous dislocation. There is circumferential tearing of the lateral meniscus and a small displaced meniscal fragment adjacent to the body. 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 ACL and PCL is unremarkable.   Assessment/Plan L knee LMT  I had an extensive discussion concerning her pathology and relevant antomy. I do feel based upon the MRI result and her history and exam that  is she is symptomatic from a lateral meniscus tear. As well as a severe chondromalacia of the tibial plateau. It is unclear as to the etiology of this pathology as she has no specific trauma event. She did play a lot of racquetball bending twisting and lateral motion however. Her  radiographs show a well-maintained space at the tibiofibular joint. And give it and given that she has failed conservative treatment including physical therapy and injection one option at this point other than living with her symptoms and activity modification would be to proceed with a knee arthroscopy. Partial lateral meniscectomy and debridement. She is aware that this may not change her symptoms may make her symptoms worse or eventually she may require a knee replacement given the grade 4 changes in the lateral compartment. However hopefully with trimming of the meniscus and debris Mott her mechanical symptoms can be minimized. I do feel that she will require avoiding weightbearing on the knee when the knee is flexed to 90 or beyond and at. We discussed higher chairs higher car to get in and out of etc.  The surgery involves outpatient. One day of crutches. No history of DVT or pulmonary embolism or stroke or heart attack or chest pain or shortness of breath.  She does not have a medical doctor. We discussed a we will obtain a an EKG and some baseline blood chemistries.  sutures out in 2 weeks. Maximum medical improvement in 6 weeks. We also discussed Visco supplementation and ongoing symptoms as we cannot return cartilage that has been more she understands. And would like to proceed.  Plan L knee arthroscopy, partial lateral meniscectomy, debridement  BISSELL, [name], PA-C 09/13/2016, 9:46 AM

## 2016-09-20 NOTE — Interval H&P Note (Signed)
History and Physical Interval Note:  09/20/2016 10:19 AM  Elizabeth Prince  has presented today for surgery, with the diagnosis of Lateral meniscus tear left knee  The various methods of treatment have been discussed with the patient and family. After consideration of risks, benefits and other options for treatment, the patient has consented to  Procedure(s) with comments: Left knee arthroscopic partial lateral menisectomy and debridement (Left) - 60 mins as a surgical intervention .  The patient's history has been reviewed, patient examined, no change in status, stable for surgery.  I have reviewed the patient's chart and labs.  Questions were answered to the patient's satisfaction.     Emberlin Verner C

## 2016-09-20 NOTE — Anesthesia Procedure Notes (Signed)
Procedure Name: LMA Insertion Date/Time: 09/20/2016 10:32 AM Performed by: Thornell MuleSTUBBLEFIELD, Aivan Fillingim G Pre-anesthesia Checklist: Patient identified, Emergency Drugs available, Suction available and Patient being monitored Patient Re-evaluated:Patient Re-evaluated prior to induction Oxygen Delivery Method: Circle system utilized Preoxygenation: Pre-oxygenation with 100% oxygen Induction Type: IV induction LMA: LMA inserted LMA Size: 4.0 Number of attempts: 1 Placement Confirmation: positive ETCO2 and breath sounds checked- equal and bilateral Tube secured with: Tape Dental Injury: Teeth and Oropharynx as per pre-operative assessment

## 2016-09-20 NOTE — Anesthesia Postprocedure Evaluation (Signed)
Anesthesia Post Note  Patient: Elizabeth LewandowskyMarsha Prince  Procedure(s) Performed: Procedure(s) (LRB): Left knee arthroscopic partial medial and lateral menisectomy and debridement (Left)     Patient location during evaluation: PACU Anesthesia Type: General Level of consciousness: awake, awake and alert and oriented Pain management: pain level controlled Vital Signs Assessment: post-procedure vital signs reviewed and stable Respiratory status: spontaneous breathing, nonlabored ventilation and respiratory function stable Cardiovascular status: blood pressure returned to baseline Anesthetic complications: no    Last Vitals:  Vitals:   09/20/16 1320 09/20/16 1350  BP: 134/85 (!) 157/83  Pulse: 90 89  Resp: 20 20  Temp: 36.8 C 36.9 C  SpO2: 95% 97%    Last Pain:  Vitals:   09/20/16 1350  TempSrc:   PainSc: 4                  Finnlee Guarnieri COKER

## 2016-09-21 NOTE — Op Note (Signed)
NAME:  Elizabeth Prince, Elizabeth Prince                     ACCOUNT NO.:  MEDICAL RECORD NO.:  [id number]  LOCATION:                                 FACILITY:  PHYSICIAN:  Jene EveryJeffrey Ramiel Forti, M.D.         DATE OF BIRTH:  DATE OF PROCEDURE:  09/20/2016 DATE OF DISCHARGE:                              OPERATIVE REPORT   PREOPERATIVE DIAGNOSIS:  Lateral meniscus tear, left knee  POSTOPERATIVE DIAGNOSIS: 1. Medial and lateral meniscus tear, left knee. 2. Chondromalacia patella. 3. Chondromalacia medial femoral condyle, lateral femoral condyle.  PROCEDURE PERFORMED: 1. Left knee arthroscopy. 2. Partial medial and lateral meniscectomy. 3. Chondroplasty of the patella and the medial femoral condyle.  ANESTHESIA:  General.  ASSISTANT:  Lanna PocheJacqueline Bissell.  HISTORY:  This is a 56 year old female who has locking and giving way. MRI indicated a lateral meniscus tear, I thought it was the patella __________ lateral meniscus.  She was indicated for knee arthroscopy and partial lateral meniscectomy.  Risks and benefits discussed including bleeding, infection, damage to neurovascular structures, no change in symptoms, worsening symptoms, DVT, PE, anesthetic complications, etc.  TECHNIQUE:  The patient in supine position after induction of adequate general anesthesia, 2 g Kefzol, left lower extremity was prepped and draped in usual sterile fashion.  A lateral parapatellar portal was fashioned with a #11 blade.  Ingress cannula atraumatically placed. Irrigant was utilized to insufflate the joint.  Under direct visualization, a medial parapatellar portal was fashioned with a #11 blade after localization with 18-gauge needle sparing the medial meniscus.  Grade 3 changes of the medial femoral condyle were noted. Radial tearing of the medial meniscus.  Introduced a 3.5 shaver and performed a light chondroplasty of femoral condyle and shaved the medial meniscus.  ACL was unremarkable.  Lateral compartment,  extensive tearing of the posterior half of the lateral meniscus.  Grade 3 changes of the femur.  I introduced a basket and resected approximately 1/2 of the posterior half further contoured with a 3.2 shaver and an ArthroWand to a good stable base to probe palpation.  Light chondroplasty performed in femoral condyle.  Remnant stable.  Suprapatellar pouch, grade 3 changes of the patella.  Light chondroplasty performed.  Normal patellofemoral tracking.  Gutters unremarkable.  No evidence of maltracking or instability of the patella was noted preoperatively on exam either.  She had good flexion-extension after the procedure.  Revisit all compartments.  No further pathology, amenable arthroscopic intervention, therefore removed all instrumentation.  Portals were closed with 4-0 nylon simple sutures; 0.25% Marcaine with epinephrine was infiltrated in the joint.  Wound was dressed sterilely, awoken without difficulty, and transported to the recovery room in satisfactory condition.  The patient tolerated the procedure well.  No complications.  Assistant, Dr. Lanna PocheJacqueline Bissell, PA, was used throughout the case due to the lateral meniscus tear, need to figure 4, and hold the leg to open up the posterior compartment and closure.     Jene EveryJeffrey Shaan Rhoads, M.D.     Cordelia PenJB/MEDQ  D:  09/20/2016  T:  09/20/2016  Job:  [id number]

## 2016-12-26 ENCOUNTER — Encounter (INDEPENDENT_AMBULATORY_CARE_PROVIDER_SITE_OTHER): Payer: Self-pay

## 2016-12-26 ENCOUNTER — Ambulatory Visit (INDEPENDENT_AMBULATORY_CARE_PROVIDER_SITE_OTHER): Payer: Commercial Managed Care - PPO | Admitting: Psychiatry

## 2016-12-26 ENCOUNTER — Encounter (HOSPITAL_COMMUNITY): Payer: Self-pay | Admitting: Psychiatry

## 2016-12-26 VITALS — BP 138/86 | HR 97 | Ht 66.0 in | Wt 214.0 lb

## 2016-12-26 DIAGNOSIS — M2550 Pain in unspecified joint: Secondary | ICD-10-CM

## 2016-12-26 DIAGNOSIS — F319 Bipolar disorder, unspecified: Secondary | ICD-10-CM | POA: Diagnosis not present

## 2016-12-26 DIAGNOSIS — Z813 Family history of other psychoactive substance abuse and dependence: Secondary | ICD-10-CM

## 2016-12-26 DIAGNOSIS — Z818 Family history of other mental and behavioral disorders: Secondary | ICD-10-CM

## 2016-12-26 MED ORDER — ALPRAZOLAM 1 MG PO TABS: 1.0000 mg | ORAL_TABLET | Freq: Three times a day (TID) | ORAL | 3 refills | Status: DC | PRN

## 2016-12-26 MED ORDER — LURASIDONE HCL 20 MG PO TABS: 20.0000 mg | ORAL_TABLET | Freq: Every day | ORAL | 1 refills | Status: DC

## 2016-12-26 MED ORDER — LAMOTRIGINE 200 MG PO TABS: 200.0000 mg | ORAL_TABLET | Freq: Every day | ORAL | 1 refills | Status: DC

## 2016-12-26 MED ORDER — QUETIAPINE FUMARATE 400 MG PO TABS: 400.0000 mg | ORAL_TABLET | Freq: Every day | ORAL | 1 refills | Status: DC

## 2016-12-26 NOTE — Progress Notes (Signed)
Psychiatric Initial Adult Assessment   Patient Identification: Elizabeth LewandowskyMarsha Prince MRN:  [id number] Date of Evaluation:  12/26/2016 Referral Source: Self Chief Complaint:  Med management Visit Diagnosis:    ICD-10-CM   1. Bipolar I disorder (HCC) F31.9 ALPRAZolam (XANAX) 1 MG tablet    lamoTRIgine (LAMICTAL) 200 MG tablet    QUEtiapine (SEROQUEL) 400 MG tablet    lurasidone (LATUDA) 20 MG TABS tablet    History of Present Illness:  Elizabeth LewandowskyMarsha Ratay is a 56 year old female with a psychiatric history of bipolar disorder, with 2 prior psychiatric hospitalizations for acute mania.  Her last hospitalization was in 2016.  She has no history of self-injurious behaviors or suicidality.  She reports that her psychiatrist at [facility] is no longer accepting her insurance, and she presents today to establish psychiatric follow-up care.  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  She reports that she loves her job.  She has a good relationship with her 2 children, age 58 and 924, and has an excellent relationship with her husband of almost 30 years.  She reports that she has been generally stable for the past 2-3 years on the current medication regimen.  She takes Seroquel 400 mg nightly, Lamictal 200 mg nightly, Latuda 20 mg, and uses Xanax 1-3 times daily.  She reports that she is able to function in her work, and enjoys her hobbies and spending time with family.  She denies any alcohol or drug use.  She presents as euthymic in terms of her mood.  I spent time educating her on the risks and benefits of Seroquel, and specifically discussed the risk of increased possibility of tardive dyskinesia with 2 antipsychotics on board.  She understands the risks associated with Latuda and Seroquel both being used in her treatment, but given her mood stability and benefits, she wishes to proceed with the current regimen.  She is not  actively engaged in individual therapy, but reports that if she was to have any decline in her mood she would be open to that.  She agrees to follow-up with writer in 3-4 months or sooner if needed.  [place] controlled [place] reviewed and appears to be appropriate.  Past Psychiatric History: 2 prior psychiatric hospitalizations, most recently in 2016  Previous Psychotropic Medications: Yes   Substance Abuse History in the last 12 months:  No.  Consequences of Substance Abuse: Negative  Past Medical History:  Past Medical History:  Diagnosis Date  . Anxiety    situational  . Arthritis    mild arthritis  . Bipolar 1 disorder (HCC)   . History of kidney stones     Past Surgical History:  Procedure Laterality Date  . CYSTOSCOPY/RETROGRADE/URETEROSCOPY/STONE EXTRACTION WITH BASKET Right 10/08/2015   Procedure: CYSTOSCOPY/RETROGRADE/URETEROSCOPY/STONE EXTRACTION WITH BASKET LASER;  Surgeon: Sebastian Acheheodore Manny, MD;  Location: [facility];  Service: Urology;  Laterality: Right;  . KIDNEY STONE SURGERY    . KNEE ARTHROSCOPY WITH LATERAL MENISECTOMY Left 09/20/2016   Procedure: Left knee arthroscopic partial medial and lateral menisectomy and debridement;  Surgeon: Jene EveryBeane, Jeffrey, MD;  Location: [facility];  Service: Orthopedics;  Laterality: Left;  60 mins  . TONSILLECTOMY      Family Psychiatric History: As below  Family History:  Family History  Problem Relation Age of Onset  . Depression Mother   . Depression Maternal Aunt   . Depression Cousin   . Drug abuse Cousin  Social History:   Social History   Socioeconomic History  . Marital status: Married    Spouse name: None  . Number of children: None  . Years of education: None  . Highest education level: None  Social Needs  . Financial resource strain: None  . Food insecurity - worry: None  . Food insecurity - inability: None  . Transportation needs - medical: None  . Transportation needs - non-medical: None   Occupational History  . None  Tobacco Use  . Smoking status: Never Smoker  . Smokeless tobacco: Never Used  Substance and Sexual Activity  . Alcohol use: Yes    Alcohol/week: 0.6 oz    Types: 1 Glasses of wine per week    Comment: occasional social  . Drug use: No  . Sexual activity: Yes    Partners: Male    Birth control/protection: None  Other Topics Concern  . None  Social History Narrative  . None    Additional Social History: Doctoral degree in Tourist information centre managerpsychology and organizational management, works at [employer] , has 2 children, married for almost 30 years  Allergies:   Allergies  Allergen Reactions  . Codeine Nausea And Vomiting    Metabolic Disorder Labs: No results found for: HGBA1C, MPG No results found for: PROLACTIN No results found for: CHOL, TRIG, HDL, CHOLHDL, VLDL, LDLCALC   Current Medications: Current Outpatient Medications  Medication Sig Dispense Refill  . ALPRAZolam (XANAX) 1 MG tablet Take 1 tablet (1 mg total) by mouth 3 (three) times daily as needed for anxiety. 90 tablet 3  . cholecalciferol (VITAMIN D) 1000 units tablet Take 1 tablet by mouth at bedtime.    Marland Kitchen. ibuprofen (ADVIL,MOTRIN) 200 MG tablet Take 600 mg by mouth every 8 (eight) hours as needed for fever, headache, mild pain, moderate pain or cramping.     . lamoTRIgine (LAMICTAL) 200 MG tablet Take 1 tablet (200 mg total) by mouth at bedtime. 90 tablet 1  . lurasidone (LATUDA) 20 MG TABS tablet Take 1 tablet (20 mg total) by mouth at bedtime. 90 tablet 1  . QUEtiapine (SEROQUEL) 400 MG tablet Take 1 tablet (400 mg total) by mouth at bedtime. 90 tablet 1  . oxyCODONE-acetaminophen (PERCOCET) 5-325 MG tablet Take 1-2 tablets by mouth every 4 (four) hours as needed for severe pain. (Patient not taking: Reported on 12/26/2016) 40 tablet 0   No current facility-administered medications for this visit.     Neurologic: Headache: Negative Seizure:  Negative Paresthesias:Negative  Musculoskeletal: Strength & Muscle Tone: within normal limits Gait & Station: normal Patient leans: N/A  Psychiatric Specialty Exam: Review of Systems  Constitutional: Negative.   HENT: Negative.   Eyes: Negative.   Respiratory: Negative.   Cardiovascular: Negative.   Gastrointestinal: Negative.   Musculoskeletal: Positive for joint pain.  Neurological: Negative.   Psychiatric/Behavioral: Negative.   All other systems reviewed and are negative.   Blood pressure 138/86, pulse 97, height 5\' 6"  (1.676 m), weight 214 lb (97.1 kg), SpO2 97 %.Body mass index is 34.54 kg/m.  General Appearance: Casual and Well Groomed  Eye Contact:  Good  Speech:  Clear and Coherent  Volume:  Normal  Mood:  Euthymic  Affect:  Appropriate and Congruent  Thought Process:  Goal Directed and Descriptions of Associations: Intact  Orientation:  Full (Time, Place, and Person)  Thought Content:  Logical  Suicidal Thoughts:  No  Homicidal Thoughts:  No  Memory:  Immediate;   Fair  Judgement:  Good  Insight:  Good  Psychomotor Activity:  Normal  Concentration:  Attention Span: Good  Recall:  Good  Fund of Knowledge:Good  Language: Good  Akathisia:  Negative  Handed:  Right  AIMS (if indicated):  Completed in office, 0  Assets:  Communication Skills Desire for Improvement Financial Resources/Insurance Housing Intimacy Leisure Time Physical Health Resilience Social Support Talents/Skills Transportation Vocational/Educational  ADL's:  Intact  Cognition: WNL  Sleep:  Good, 7-9 hours    Treatment Plan Summary: Makenleigh Crownover is a 56 year old female with a psychiatric history consistent with bipolar 1 disorder.  She presents today for a transfer of her psychiatric medication management given a change in her healthcare insurance.  She has been stable for over 2 years on the current medication regimen and does not present any acute safety issues, suicidality or  substance abuse.  She has a PhD in organizational psychology and is consistently employed at [employer], has an excellent support system from her husband and 2 children.  We will follow-up in 3-4 months for routine medication management, or sooner if needed.  I spent time today forming rapport and spent time educating the patient about the pharmacologic interventions, including the risk of tardive dyskinesia and metabolic side effects from antipsychotic.  1. Bipolar I disorder (HCC)     Status of current problems: stable  Labs Ordered: No orders of the defined types were placed in this encounter.   Labs Reviewed: n/a  Collateral Obtained/Records Reviewed: n/a  Plan:  Aims completed in office today, unremarkable Continue Seroquel 400 mg nightly Continue Latuda 20 mg nightly Continue Lamictal 200 mg nightly Continue Xanax 1 mg 3 times a day as needed for anxiety or panic Return to clinic in 3-4 months Will obtain routine laboratory monitoring at follow-up  I spent 40 minutes with the patient in direct face-to-face clinical care.  Greater than 50% of this time was spent in counseling and coordination of care with the patient.    Burnard Leigh, MD 12/12/20181:52 PM

## 2017-05-09 ENCOUNTER — Encounter (HOSPITAL_COMMUNITY): Payer: Self-pay | Admitting: Psychiatry

## 2017-05-09 ENCOUNTER — Ambulatory Visit (HOSPITAL_COMMUNITY): Payer: Commercial Managed Care - PPO | Admitting: Psychiatry

## 2017-05-09 DIAGNOSIS — Z79899 Other long term (current) drug therapy: Secondary | ICD-10-CM | POA: Diagnosis not present

## 2017-05-09 DIAGNOSIS — Z813 Family history of other psychoactive substance abuse and dependence: Secondary | ICD-10-CM | POA: Diagnosis not present

## 2017-05-09 DIAGNOSIS — Z561 Change of job: Secondary | ICD-10-CM | POA: Diagnosis not present

## 2017-05-09 DIAGNOSIS — F319 Bipolar disorder, unspecified: Secondary | ICD-10-CM | POA: Diagnosis not present

## 2017-05-09 DIAGNOSIS — Z818 Family history of other mental and behavioral disorders: Secondary | ICD-10-CM | POA: Diagnosis not present

## 2017-05-09 MED ORDER — QUETIAPINE FUMARATE 400 MG PO TABS: 400.0000 mg | ORAL_TABLET | Freq: Every day | ORAL | 1 refills | Status: DC

## 2017-05-09 MED ORDER — ALPRAZOLAM 1 MG PO TABS: 1.0000 mg | ORAL_TABLET | Freq: Three times a day (TID) | ORAL | 0 refills | Status: DC | PRN

## 2017-05-09 MED ORDER — ALPRAZOLAM 1 MG PO TABS: 1.0000 mg | ORAL_TABLET | Freq: Three times a day (TID) | ORAL | 2 refills | Status: DC | PRN

## 2017-05-09 MED ORDER — LAMOTRIGINE 200 MG PO TABS: 200.0000 mg | ORAL_TABLET | Freq: Every day | ORAL | 1 refills | Status: DC

## 2017-05-09 MED ORDER — LURASIDONE HCL 20 MG PO TABS: 20.0000 mg | ORAL_TABLET | Freq: Every day | ORAL | 1 refills | Status: DC

## 2017-05-09 NOTE — Progress Notes (Signed)
BH MD/PA/NP OP Progress Note  05/09/2017 4:09 PM Elizabeth Prince  MRN:  [id number]  Chief Complaint: medication management  HPI: Elizabeth Prince shares good news that she has received a job offer [employer], that is a significant promotion from her current position at [employer].  She will be moving there in about 1 week to begin training for the position.  She is incredibly excited and hopeful that this will be a positive change.  We discussed the transition of her psychiatric care locally.  She will present to this clinic for 1 additional visit in July, and will be working on finding a psychiatric provider near her work.  Her husband will move over the next 12 months as he puts the house for sale and transitions his job.  I spent time with her celebrating this change and congratulating her.  She reports that her mood has remained generally stable on the current regimen, I provided to 3 months worth of refill.  No acute safety issues.  Visit Diagnosis:    ICD-10-CM   1. Bipolar I disorder (HCC) F31.9 lamoTRIgine (LAMICTAL) 200 MG tablet    lurasidone (LATUDA) 20 MG TABS tablet    QUEtiapine (SEROQUEL) 400 MG tablet    ALPRAZolam (XANAX) 1 MG tablet    DISCONTINUED: ALPRAZolam (XANAX) 1 MG tablet    Past Psychiatric History: See intake H&P for full details. Reviewed, with no updates at this time.   Past Medical History:  Past Medical History:  Diagnosis Date  . Anxiety    situational  . Arthritis    mild arthritis  . Bipolar 1 disorder (HCC)   . History of kidney stones     Past Surgical History:  Procedure Laterality Date  . CYSTOSCOPY/RETROGRADE/URETEROSCOPY/STONE EXTRACTION WITH BASKET Right 10/08/2015   Procedure: CYSTOSCOPY/RETROGRADE/URETEROSCOPY/STONE EXTRACTION WITH BASKET LASER;  Surgeon: [name], MD;  Location: [facility];  Service: Urology;  Laterality: Right;  . KIDNEY STONE SURGERY    . KNEE ARTHROSCOPY WITH LATERAL MENISECTOMY Left 09/20/2016   Procedure: Left  knee arthroscopic partial medial and lateral menisectomy and debridement;  Surgeon: [name], MD;  Location: [facility];  Service: Orthopedics;  Laterality: Left;  60 mins  . TONSILLECTOMY      Family Psychiatric History: See intake H&P for full details. Reviewed, with no updates at this time.   Family History:  Family History  Problem Relation Age of Onset  . Depression Mother   . Depression Maternal Aunt   . Depression Cousin   . Drug abuse Cousin     Social History:  Social History   Socioeconomic History  . Marital status: Married    Spouse name: Not on file  . Number of children: Not on file  . Years of education: Not on file  . Highest education level: Not on file  Occupational History  . Not on file  Social Needs  . Financial resource strain: Not on file  . Food insecurity:    Worry: Not on file    Inability: Not on file  . Transportation needs:    Medical: Not on file    Non-medical: Not on file  Tobacco Use  . Smoking status: Never Smoker  . Smokeless tobacco: Never Used  Substance and Sexual Activity  . Alcohol use: Yes    Alcohol/week: 0.6 oz    Types: 1 Glasses of wine per week    Comment: occasional social  . Drug use: No  . Sexual activity: Yes    Partners:  Male    Birth control/protection: None  Lifestyle  . Physical activity:    Days per week: Not on file    Minutes per session: Not on file  . Stress: Not on file  Relationships  . Social connections:    Talks on phone: Not on file    Gets together: Not on file    Attends religious service: Not on file    Active member of club or organization: Not on file    Attends meetings of clubs or organizations: Not on file    Relationship status: Not on file  Other Topics Concern  . Not on file  Social History Narrative  . Not on file    Allergies:  Allergies  Allergen Reactions  . Codeine Nausea And Vomiting    Metabolic Disorder Labs: No results found for: HGBA1C, MPG No results found  for: PROLACTIN No results found for: CHOL, TRIG, HDL, CHOLHDL, VLDL, LDLCALC No results found for: TSH  Therapeutic Level Labs: No results found for: LITHIUM No results found for: VALPROATE No components found for:  CBMZ  Current Medications: Current Outpatient Medications  Medication Sig Dispense Refill  . ALPRAZolam (XANAX) 1 MG tablet Take 1 tablet (1 mg total) by mouth 3 (three) times daily as needed for anxiety. 90 tablet 2  . cholecalciferol (VITAMIN D) 1000 units tablet Take 1 tablet by mouth at bedtime.    Marland Kitchen ibuprofen (ADVIL,MOTRIN) 200 MG tablet Take 600 mg by mouth every 8 (eight) hours as needed for fever, headache, mild pain, moderate pain or cramping.     . lamoTRIgine (LAMICTAL) 200 MG tablet Take 1 tablet (200 mg total) by mouth at bedtime. 90 tablet 1  . lurasidone (LATUDA) 20 MG TABS tablet Take 1 tablet (20 mg total) by mouth at bedtime. 90 tablet 1  . oxyCODONE-acetaminophen (PERCOCET) 5-325 MG tablet Take 1-2 tablets by mouth every 4 (four) hours as needed for severe pain. (Patient not taking: Reported on 12/26/2016) 40 tablet 0  . QUEtiapine (SEROQUEL) 400 MG tablet Take 1 tablet (400 mg total) by mouth at bedtime. 90 tablet 1   No current facility-administered medications for this visit.     Musculoskeletal: Strength & Muscle Tone: within normal limits Gait & Station: normal Patient leans: N/A  Psychiatric Specialty Exam: ROS  There were no vitals taken for this visit.There is no height or weight on file to calculate BMI.  General Appearance: Casual and Well Groomed  Eye Contact:  Good  Speech:  Clear and Coherent  Volume:  Normal  Mood:  Euthymic and happy  Affect:  Congruent  Thought Process:  Goal Directed and Descriptions of Associations: Intact  Orientation:  Full (Time, Place, and Person)  Thought Content: Logical   Suicidal Thoughts:  No  Homicidal Thoughts:  No  Memory:  Immediate;   Good  Judgement:  Good  Insight:  Good  Psychomotor  Activity:  Normal  Concentration:  Concentration: Good  Recall:  Good  Fund of Knowledge: Good  Language: Good  Akathisia:  Negative  Handed:  Right  AIMS (if indicated): not done  Assets:  Communication Skills Desire for Improvement Financial Resources/Insurance Housing Intimacy Leisure Time Physical Health Resilience Social Support Talents/Skills Transportation Vocational/Educational  ADL's:  Intact  Cognition: WNL  Sleep:  Good   Screenings:   Assessment and Plan: Karrigan Messamore is a 57 year old female with a psychiatric history consistent with bipolar 1 disorder.  She is on a complex regimen which appears to be quite  effective in helping her to maintain stability of her mood and anxiety symptoms.  She understands the risks of dual antipsychotic treatment, but the benefits of far outweighed the risks in her case.  She continues on the medication as below.  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  She will work on finding a psychiatrist locally, and will return to follow-up with this Clinical research associatewriter in July for routine medication management check, in the meantime until she finds a new psychiatrist.  No acute safety issues, and she seems to be in a generally positive healthy mental state.  1. Bipolar I disorder (HCC)     Status of current problems: stable  Labs Ordered: No orders of the defined types were placed in this encounter.   Labs Reviewed: n/a  Collateral Obtained/Records Reviewed: nccsd  Plan:  Continue Seroquel 400 mg nightly Continue Latuda 20 mg nightly Continue Lamictal 200 mg nightly Xanax 1 mg 3 times daily as needed for anxiety or panic I have provided her paper prescription for her to take with her to [place] for refills of Xanax Follow-up in July, patient will work on establishing psychiatric care locally in [place] in the meantime  I spent 20 minutes with the patient in direct  face-to-face clinical care.  Greater than 50% of this time was spent in counseling and coordination of care with the patient.    Burnard LeighAlexander Arya Day Greb, MD 05/09/2017, 4:09 PM

## 2017-07-15 ENCOUNTER — Ambulatory Visit (HOSPITAL_COMMUNITY): Payer: Commercial Managed Care - PPO | Admitting: Psychiatry

## 2017-07-15 ENCOUNTER — Encounter (HOSPITAL_COMMUNITY): Payer: Self-pay | Admitting: Psychiatry

## 2017-07-15 DIAGNOSIS — F319 Bipolar disorder, unspecified: Secondary | ICD-10-CM

## 2017-07-15 MED ORDER — ALPRAZOLAM 1 MG PO TABS: 1.0000 mg | ORAL_TABLET | Freq: Three times a day (TID) | ORAL | 2 refills | Status: DC | PRN

## 2017-07-15 NOTE — Progress Notes (Signed)
BH MD/PA/NP OP Progress Note  07/15/2017 8:27 AM Elizabeth Prince  MRN:  [id number]  Chief Complaint: medication management  HPI: Elizabeth Prince has adjusted fairly well to the new job, had some mild hypomania for a few days but this calmed down. Sleeping well. No si, hi, avh. Is seeing pcp Tuesday for a referral to psychiatrist locally in [place].  This will be our final visit, and she will contact office if any acute concerns arise.  Continues on Seroquel, Latuda, Lamictal, and Xanax as needed.  Visit Diagnosis:    ICD-10-CM   1. Bipolar I disorder (HCC) F31.9 ALPRAZolam (XANAX) 1 MG tablet    Past Psychiatric History: See intake H&P for full details. Reviewed, with no updates at this time.   Past Medical History:  Past Medical History:  Diagnosis Date  . Anxiety    situational  . Arthritis    mild arthritis  . Bipolar 1 disorder (HCC)   . History of kidney stones     Past Surgical History:  Procedure Laterality Date  . CYSTOSCOPY/RETROGRADE/URETEROSCOPY/STONE EXTRACTION WITH BASKET Right 10/08/2015   Procedure: CYSTOSCOPY/RETROGRADE/URETEROSCOPY/STONE EXTRACTION WITH BASKET LASER;  Surgeon: Sebastian Acheheodore Manny, MD;  Location: [facility];  Service: Urology;  Laterality: Right;  . KIDNEY STONE SURGERY    . KNEE ARTHROSCOPY WITH LATERAL MENISECTOMY Left 09/20/2016   Procedure: Left knee arthroscopic partial medial and lateral menisectomy and debridement;  Surgeon: Jene EveryBeane, Jeffrey, MD;  Location: [facility];  Service: Orthopedics;  Laterality: Left;  60 mins  . TONSILLECTOMY      Family Psychiatric History: See intake H&P for full details. Reviewed, with no updates at this time.   Family History:  Family History  Problem Relation Age of Onset  . Depression Mother   . Depression Maternal Aunt   . Depression Cousin   . Drug abuse Cousin     Social History:  Social History   Socioeconomic History  . Marital status: Married    Spouse name: Not on file  . Number of children: Not on file   . Years of education: Not on file  . Highest education level: Not on file  Occupational History  . Not on file  Social Needs  . Financial resource strain: Not on file  . Food insecurity:    Worry: Not on file    Inability: Not on file  . Transportation needs:    Medical: Not on file    Non-medical: Not on file  Tobacco Use  . Smoking status: Never Smoker  . Smokeless tobacco: Never Used  Substance and Sexual Activity  . Alcohol use: Yes    Alcohol/week: 0.6 oz    Types: 1 Glasses of wine per week    Comment: occasional social  . Drug use: No  . Sexual activity: Yes    Partners: Male    Birth control/protection: None  Lifestyle  . Physical activity:    Days per week: Not on file    Minutes per session: Not on file  . Stress: Not on file  Relationships  . Social connections:    Talks on phone: Not on file    Gets together: Not on file    Attends religious service: Not on file    Active member of club or organization: Not on file    Attends meetings of clubs or organizations: Not on file    Relationship status: Not on file  Other Topics Concern  . Not on file  Social History Narrative  . Not on  file    Allergies:  Allergies  Allergen Reactions  . Codeine Nausea And Vomiting    Metabolic Disorder Labs: No results found for: HGBA1C, MPG No results found for: PROLACTIN No results found for: CHOL, TRIG, HDL, CHOLHDL, VLDL, LDLCALC No results found for: TSH  Therapeutic Level Labs: No results found for: LITHIUM No results found for: VALPROATE No components found for:  CBMZ  Current Medications: Current Outpatient Medications  Medication Sig Dispense Refill  . ALPRAZolam (XANAX) 1 MG tablet Take 1 tablet (1 mg total) by mouth 3 (three) times daily as needed for anxiety. 90 tablet 2  . cholecalciferol (VITAMIN D) 1000 units tablet Take 1 tablet by mouth at bedtime.    Marland Kitchen ibuprofen (ADVIL,MOTRIN) 200 MG tablet Take 600 mg by mouth every 8 (eight) hours as needed  for fever, headache, mild pain, moderate pain or cramping.     . lamoTRIgine (LAMICTAL) 200 MG tablet Take 1 tablet (200 mg total) by mouth at bedtime. 90 tablet 1  . lurasidone (LATUDA) 20 MG TABS tablet Take 1 tablet (20 mg total) by mouth at bedtime. 90 tablet 1  . oxyCODONE-acetaminophen (PERCOCET) 5-325 MG tablet Take 1-2 tablets by mouth every 4 (four) hours as needed for severe pain. 40 tablet 0  . QUEtiapine (SEROQUEL) 400 MG tablet Take 1 tablet (400 mg total) by mouth at bedtime. 90 tablet 1   No current facility-administered medications for this visit.     Musculoskeletal: Strength & Muscle Tone: within normal limits Gait & Station: normal Patient leans: N/A  Psychiatric Specialty Exam: ROS  Blood pressure 124/87, pulse 90, height 5\' 6"  (1.676 m), weight 190 lb 9.6 oz (86.5 kg), SpO2 97 %.Body mass index is 30.76 kg/m.  General Appearance: Casual and Well Groomed  Eye Contact:  Good  Speech:  Clear and Coherent  Volume:  Normal  Mood:  Euthymic and happy  Affect:  Congruent  Thought Process:  Goal Directed and Descriptions of Associations: Intact  Orientation:  Full (Time, Place, and Person)  Thought Content: Logical   Suicidal Thoughts:  No  Homicidal Thoughts:  No  Memory:  Immediate;   Good  Judgement:  Good  Insight:  Good  Psychomotor Activity:  Normal  Concentration:  Concentration: Good  Recall:  Good  Fund of Knowledge: Good  Language: Good  Akathisia:  Negative  Handed:  Right  AIMS (if indicated): not done  Assets:  Communication Skills Desire for Improvement Financial Resources/Insurance Housing Intimacy Leisure Time Physical Health Resilience Social Support Talents/Skills Transportation Vocational/Educational  ADL's:  Intact  Cognition: WNL  Sleep:  Good   Screenings:  Assessment and Plan: Elizabeth Prince is a 57 year old female with a psychiatric history consistent with bipolar 1 disorder.  She is on a complex regimen which appears to be  quite effective in helping her to maintain stability of her mood and anxiety symptoms.   She has made the transition to [place] and seems to be doing quite well in terms of her mood.  She had a few days of hypomania about 2 weeks ago, but this resolved as she took Xanax 3 times a day to make sure she was able to calm down her rapid thinking.  She continues to sleep well at night.  Reports that she is enjoying the new job and the challenge of the new job.  No acute safety issues, she is working on establishing care locally in [place] and has an appointment with her primary care provider on Tuesday  in [place].  She will ask for a psychiatric referral at that time and I have sent 3 months refill.  1. Bipolar I disorder (HCC)     Status of current problems: stable  Labs Ordered: No orders of the defined types were placed in this encounter.   Labs Reviewed: n/a  Collateral Obtained/Records Reviewed: nccsd  Plan:  Continue Seroquel 400 mg nightly Continue Latuda 20 mg nightly Continue Lamictal 200 mg nightly Xanax 1 mg 3 times daily as needed for anxiety or panic   Burnard Leigh, MD 07/15/2017, 8:27 AM

## 2017-09-14 ENCOUNTER — Ambulatory Visit (HOSPITAL_COMMUNITY): Payer: Commercial Managed Care - PPO | Admitting: Psychiatry

## 2017-10-26 ENCOUNTER — Encounter (HOSPITAL_COMMUNITY): Payer: Self-pay | Admitting: Psychiatry

## 2017-10-26 ENCOUNTER — Ambulatory Visit (INDEPENDENT_AMBULATORY_CARE_PROVIDER_SITE_OTHER): Payer: Commercial Managed Care - PPO | Admitting: Psychiatry

## 2017-10-26 VITALS — BP 126/74 | HR 74 | Ht 67.0 in | Wt 192.0 lb

## 2017-10-26 DIAGNOSIS — F413 Other mixed anxiety disorders: Secondary | ICD-10-CM | POA: Diagnosis not present

## 2017-10-26 DIAGNOSIS — F319 Bipolar disorder, unspecified: Secondary | ICD-10-CM

## 2017-10-26 MED ORDER — QUETIAPINE FUMARATE 400 MG PO TABS: 400.0000 mg | ORAL_TABLET | Freq: Every day | ORAL | 1 refills | Status: DC

## 2017-10-26 MED ORDER — LAMOTRIGINE 200 MG PO TABS: 200.0000 mg | ORAL_TABLET | Freq: Every day | ORAL | 1 refills | Status: DC

## 2017-10-26 MED ORDER — LURASIDONE HCL 20 MG PO TABS: 20.0000 mg | ORAL_TABLET | Freq: Every day | ORAL | 1 refills | Status: DC

## 2017-10-26 NOTE — Progress Notes (Signed)
BH MD/PA/NP OP Progress Note  10/26/2017 9:31 AM Elizabeth Prince  MRN:  [id number]  Chief Complaint:  Chief Complaint    Follow-up     HPI: 57 years old currently married Caucasian female coming from [place] diagnosed with bipolar 1 disorder with history of admission being manic in the past.  She is doing reasonably with her current medication she understands she is on 2 antipsychotics she has been a patient of 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 Bipolar I disorder (HCC) F31.9 QUEtiapine (SEROQUEL) 400 MG tablet    lurasidone (LATUDA) 20 MG TABS tablet    lamoTRIgine (LAMICTAL) 200 MG tablet  2. 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 Anxiety    situational  . Arthritis    mild arthritis  . Bipolar 1 disorder (HCC)   . History of kidney stones     Past Surgical History:  Procedure Laterality Date  . CYSTOSCOPY/RETROGRADE/URETEROSCOPY/STONE EXTRACTION WITH BASKET Right 10/08/2015   Procedure: CYSTOSCOPY/RETROGRADE/URETEROSCOPY/STONE EXTRACTION WITH BASKET LASER;  Surgeon: [name], MD;  Location: [facility];  Service: Urology;  Laterality: Right;  . KIDNEY STONE SURGERY    . KNEE ARTHROSCOPY WITH LATERAL MENISECTOMY Left 09/20/2016    Procedure: Left knee arthroscopic partial medial and lateral menisectomy and debridement;  Surgeon: [name], MD;  Location: [facility];  Service: Orthopedics;  Laterality: Left;  60 mins  . TONSILLECTOMY       Family History:  Family History  Problem Relation Age of Onset  . Depression Mother   . Depression Maternal Aunt   . Depression Cousin   . Drug abuse Cousin     Social History:  Social History   Socioeconomic History  . Marital status: Married    Spouse name: Not on file  . Number of children: Not on file  . Years of education: Not on file  . Highest education level: Not on file  Occupational History  . Not on file  Social Needs  . Financial resource strain: Not on file  . Food insecurity:    Worry: Not on file    Inability: Not on file  . Transportation needs:    Medical: Not on file    Non-medical: Not on file  Tobacco Use  . Smoking status: Never Smoker  . Smokeless tobacco: Never Used  Substance and Sexual Activity  . Alcohol use: Yes    Alcohol/week: 1.0 standard drinks    Types: 1 Glasses of wine per week    Comment: occasional social  . Drug use: No  . Sexual activity: Yes    Partners: Male    Birth control/protection: None  Lifestyle  .  Physical activity:    Days per week: Not on file    Minutes per session: Not on file  . Stress: Not on file  Relationships  . Social connections:    Talks on phone: Not on file    Gets together: Not on file    Attends religious service: Not on file    Active member of club or organization: Not on file    Attends meetings of clubs or organizations: Not on file    Relationship status: Not on file  Other Topics Concern  . Not on file  Social History Narrative  . Not on file    Allergies:  Allergies  Allergen Reactions  . Codeine Nausea And Vomiting    Metabolic Disorder Labs: No results found for: HGBA1C, MPG No results found for: PROLACTIN No results found for: CHOL, TRIG, HDL, CHOLHDL, VLDL,  LDLCALC No results found for: TSH  Therapeutic Level Labs: No results found for: LITHIUM No results found for: VALPROATE No components found for:  CBMZ  Current Medications: Current Outpatient Medications  Medication Sig Dispense Refill  . ALPRAZolam (XANAX) 1 MG tablet Take 1 tablet (1 mg total) by mouth 3 (three) times daily as needed for anxiety. 90 tablet 2  . cholecalciferol (VITAMIN D) 1000 units tablet Take 1 tablet by mouth at bedtime.    Marland Kitchen ibuprofen (ADVIL,MOTRIN) 200 MG tablet Take 600 mg by mouth every 8 (eight) hours as needed for fever, headache, mild pain, moderate pain or cramping.     . lamoTRIgine (LAMICTAL) 200 MG tablet Take 1 tablet (200 mg total) by mouth at bedtime. 90 tablet 1  . lurasidone (LATUDA) 20 MG TABS tablet Take 1 tablet (20 mg total) by mouth at bedtime. 90 tablet 1  . QUEtiapine (SEROQUEL) 400 MG tablet Take 1 tablet (400 mg total) by mouth at bedtime. 90 tablet 1   No current facility-administered medications for this visit.      Musculoskeletal: Strength & Muscle Tone: within normal limits Gait & Station: normal Patient leans: no lean  Psychiatric Specialty Exam: Review of Systems  Cardiovascular: Negative for chest pain.  Skin: Negative for rash.  Neurological: Negative for tremors.    Blood pressure 126/74, pulse 74, height 5\' 7"  (1.702 m), weight 192 lb (87.1 kg).Body mass index is 30.07 kg/m.  General Appearance: Casual  Eye Contact:  Good  Speech:  Normal Rate  Volume:  Normal  Mood:  Euthymic  Affect:  Congruent  Thought Process:  Goal Directed  Orientation:  Full (Time, Place, and Person)  Thought Content: Logical   Suicidal Thoughts:  No  Homicidal Thoughts:  No  Memory:  Immediate;   Fair Recent;   Fair  Judgement:  Fair  Insight:  Fair  Psychomotor Activity:  Normal  Concentration:  Concentration: Fair and Attention Span: Fair  Recall:  Good  Fund of Knowledge: Good  Language: Good  Akathisia:  No  Handed:  Right   AIMS (if indicated): no abnomral involuntary movements   Assets:  Desire for Improvement Social Support  ADL's:  Intact  Cognition: WNL  Sleep:  Fair   Screenings:   Assessment and Plan: Bipolar disroder, I : Remained stable on a complex medication regimen no rash with reported side effects she understands she is on 2 antipsychotic but she wants to continue since it has kept her stable for the last few years.  Anxiety disorder mixed type.  She takes Xanax 1- 2  a day but is not taking  3 times a day she understands the risks of no drug use or dependence  Considering the stability and awareness of her medication will continue follow-up in 3 months medications refilled except Xanax that she can call in when she is ready for another refill   Thresa Ross, MD 10/26/2017, 9:31 AM

## 2017-10-31 IMAGING — CT CT RENAL STONE PROTOCOL
2 of 3 series · 16 of 46 positions shown, 18 images · non-contrast
Comparison: 10/08/2015

CLINICAL DATA: Left-sided flank pain radiates to left groin. Nausea
and vomiting.

EXAM:
CT ABDOMEN AND PELVIS WITHOUT CONTRAST
TECHNIQUE: Multidetector CT imaging of the abdomen and pelvis was performed
following the standard protocol without IV contrast.

[Series 4: lung · axial · 0.88mm/px · z∈[+1424,+1542]mm · 13 of 69 slices shown, 15 images]
[im 5/69  soft-tissue]
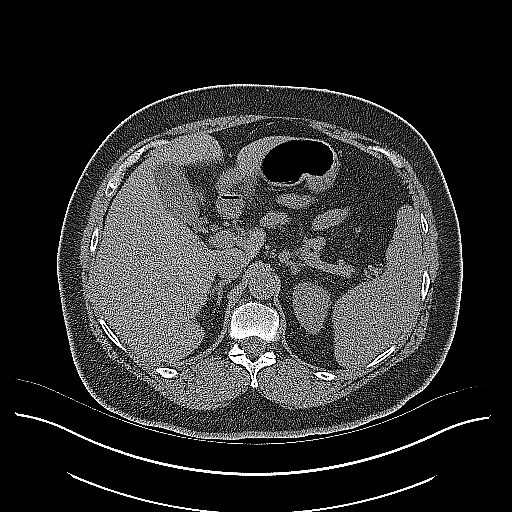
[im 5/69  bone]
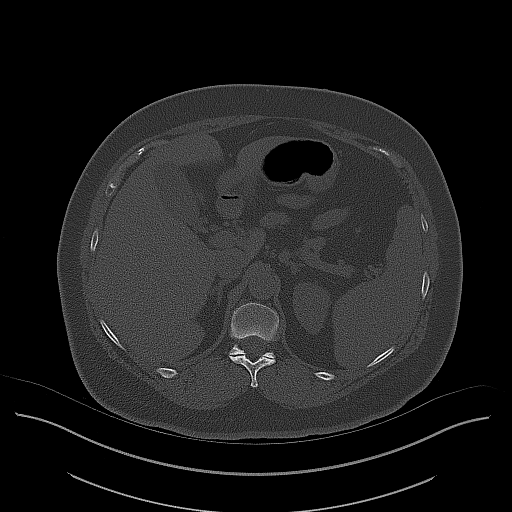
[im 9/69  soft-tissue]
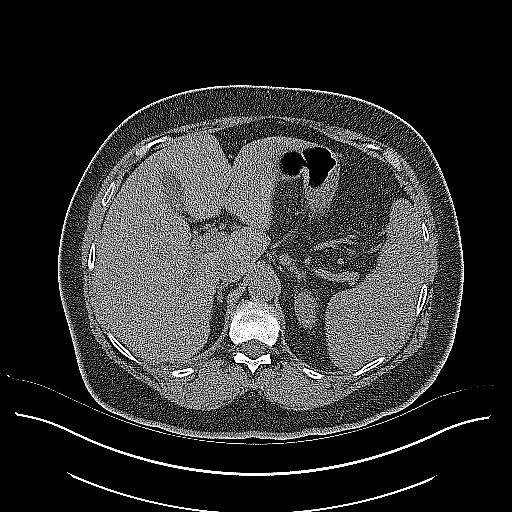
[im 14/69  soft-tissue]
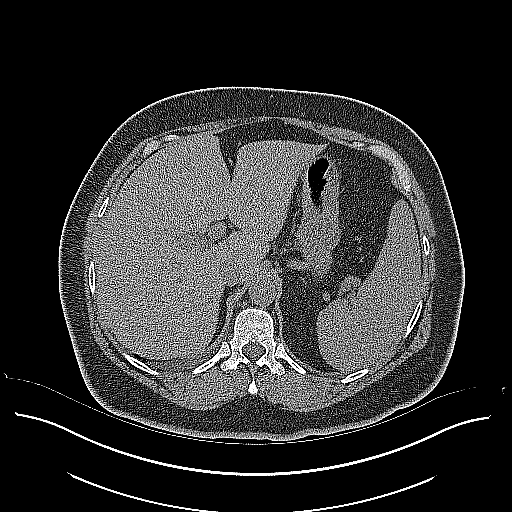
[im 20/69  soft-tissue]
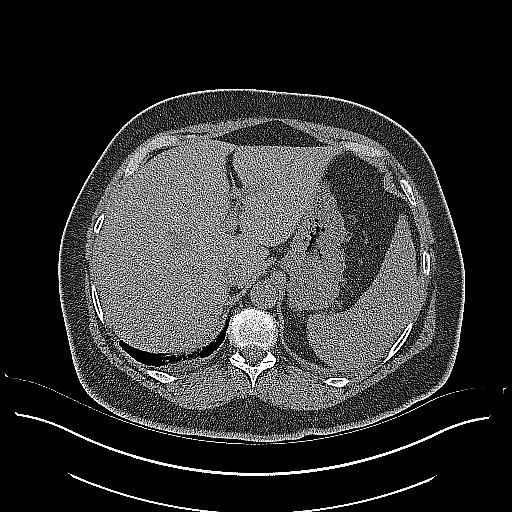
[im 25/69  soft-tissue]
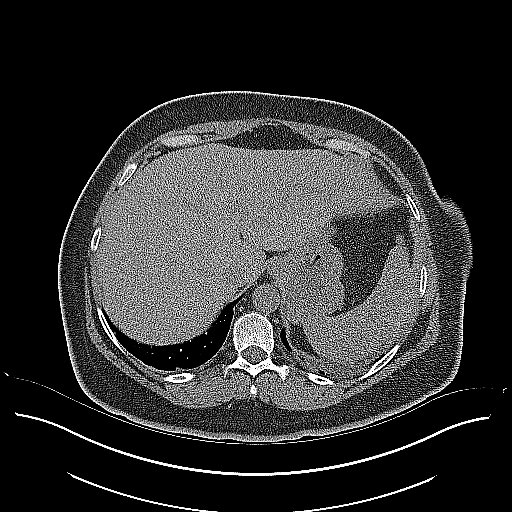
[im 29/69  soft-tissue]
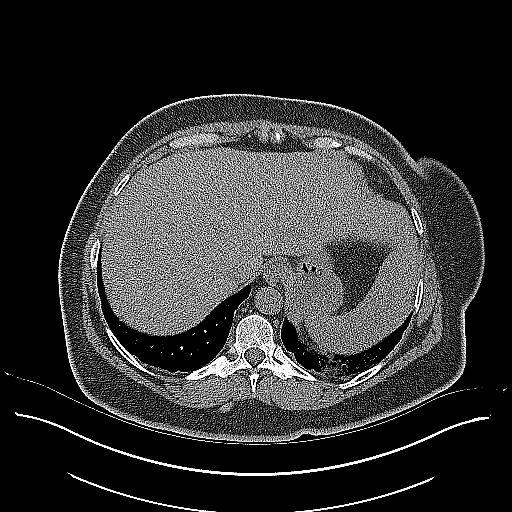
[im 36/69  soft-tissue]
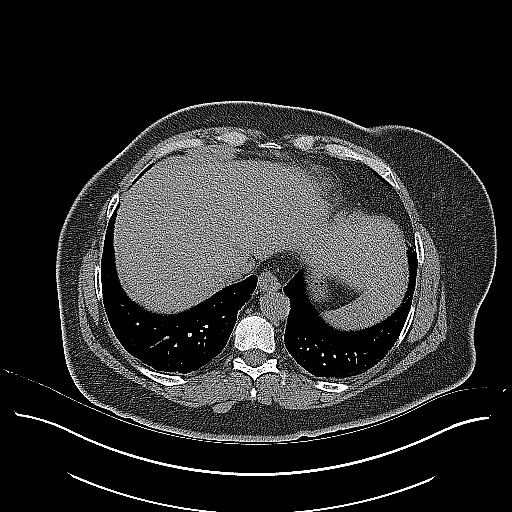
[im 40/69  soft-tissue]
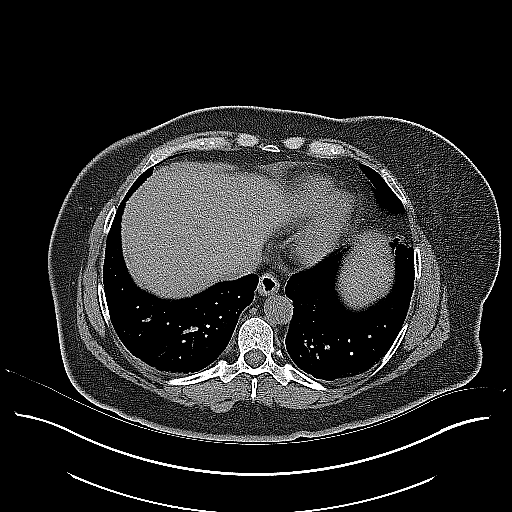
[im 44/69  soft-tissue]
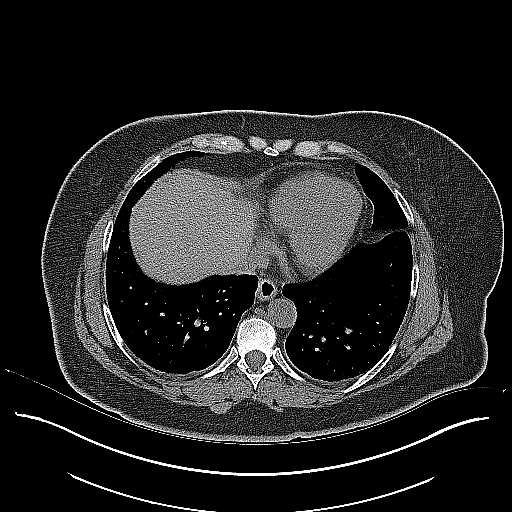
[im 44/69  bone]
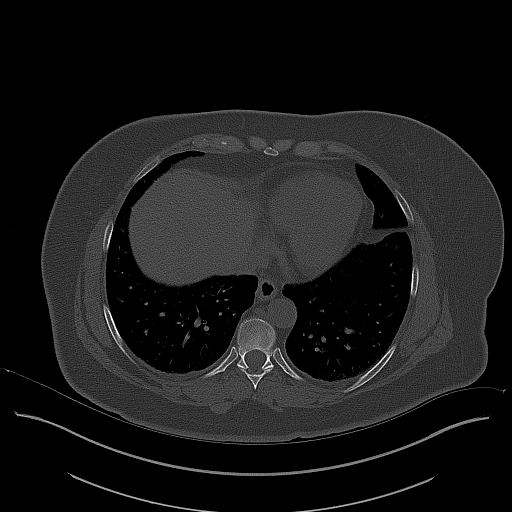
[im 49/69  soft-tissue]
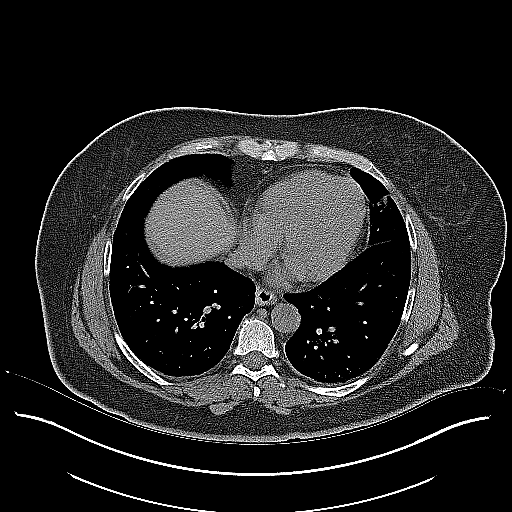
[im 55/69  soft-tissue]
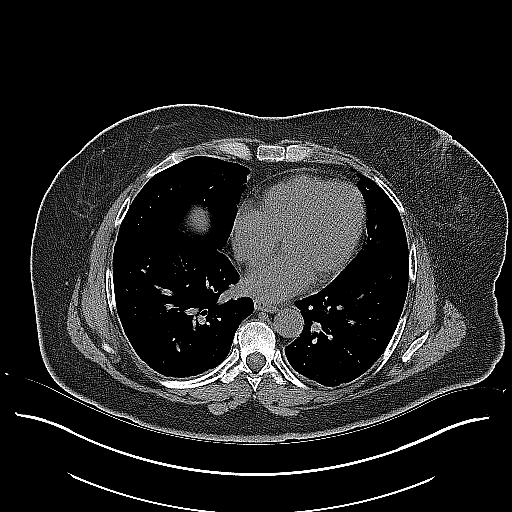
[im 60/69  soft-tissue]
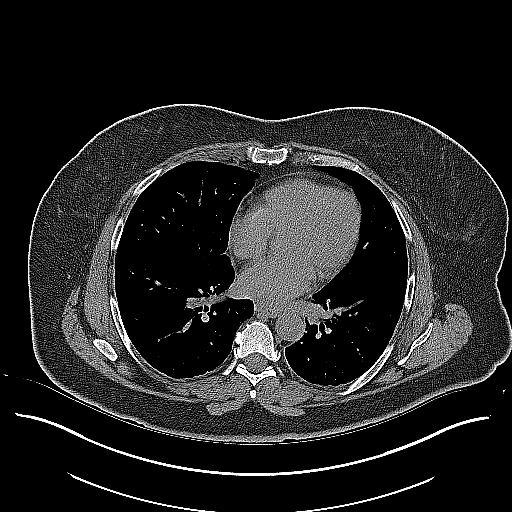
[im 64/69  soft-tissue]
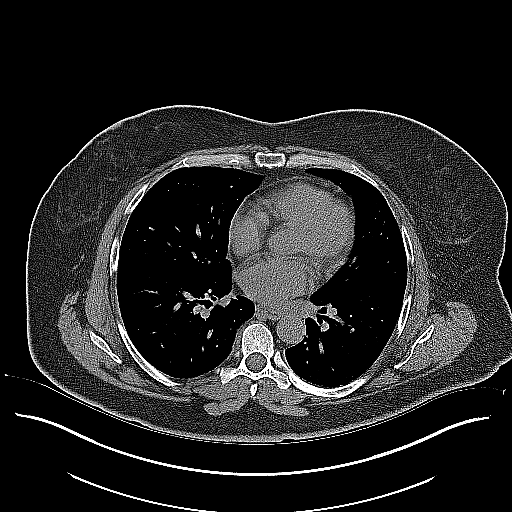

[Series 5: coronal · coronal · 0.80mm/px · 3 of 156 slices shown]
[im 52/156  soft-tissue]
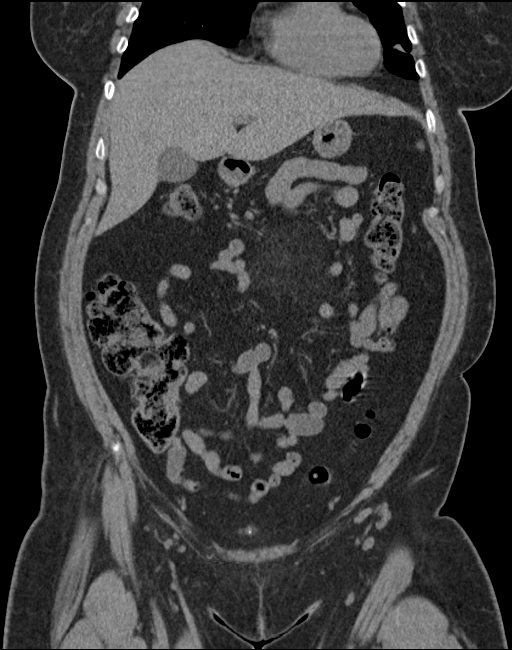
[im 69/156  soft-tissue]
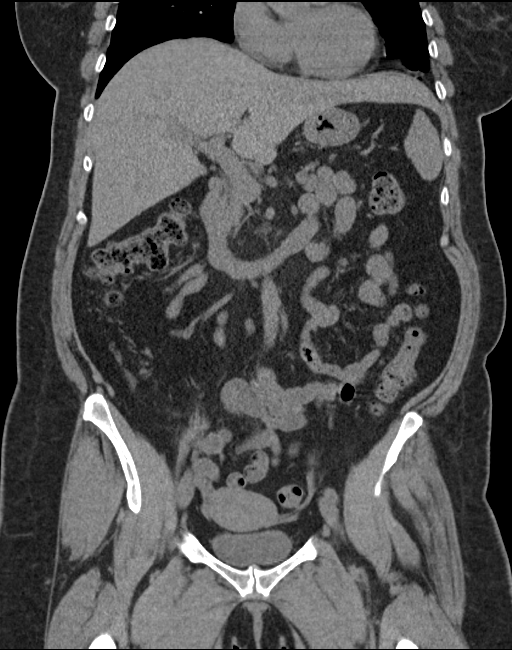
[im 87/156  soft-tissue]
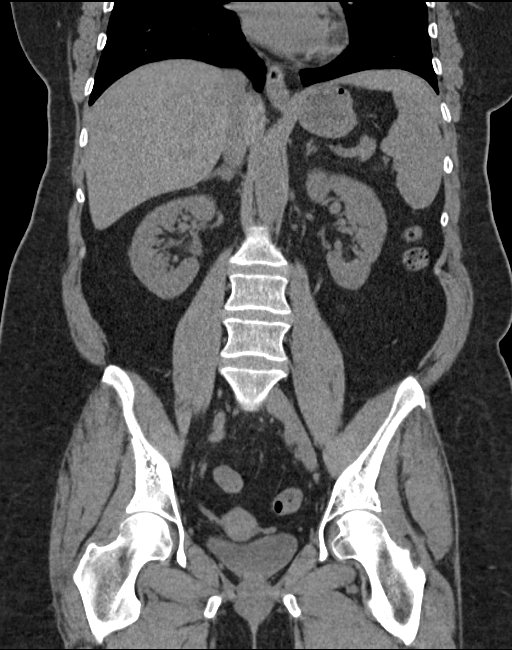

[16 of 46 positions shown; findings below may reference images not displayed]

FINDINGS: Lower chest:  Compressive atelectasis lung bases.

Hepatobiliary: No focal abnormality within the liver parenchyma. 6
mm hypodensity lateral segment unchanged, likely cyst. There is no
evidence for gallstones, gallbladder wall thickening, or
pericholecystic fluid. No intrahepatic or extrahepatic biliary
dilation.

Pancreas: No focal mass lesion. No dilatation of the main duct. No
intraparenchymal cyst. No peripancreatic edema.

Spleen: No splenomegaly. No focal mass lesion.

Adrenals/Urinary Tract: No adrenal nodule or mass.

No right renal or ureteral stones. No secondary changes in the right
kidney or ureter.

5 x 5 x 7 mm nonobstructing stone identified lower pole left kidney
no left ureteral stone. Tiny 1-2 mm left upper pole stone best seen
on coronal image 103 of series 5. No secondary changes left kidney
or ureter. No bladder stones.

Stomach/Bowel: Stomach is nondistended. No gastric wall thickening.
No evidence of outlet obstruction. Duodenum is normally positioned
as is the ligament of Treitz. No small bowel wall thickening. No
small bowel dilatation. The terminal ileum is normal. The appendix
is normal. Diverticular changes are noted in the left colon without
evidence of diverticulitis.

Vascular/Lymphatic: There is abdominal aortic atherosclerosis
without aneurysm. There is no gastrohepatic or hepatoduodenal
ligament lymphadenopathy. No intraperitoneal or retroperitoneal
lymphadenopathy. No pelvic sidewall lymphadenopathy.

Reproductive: The uterus has normal CT imaging appearance. There is
no adnexal mass.

Other: No intraperitoneal free fluid.

Musculoskeletal: Bone windows reveal no worrisome lytic or sclerotic
osseous lesions.
IMPRESSION: 1. 7 mm and 2 mm nonobstructing left renal stones. No secondary
changes in either kidney or ureter.
2. Otherwise no acute findings in the abdomen or pelvis.

## 2017-11-21 ENCOUNTER — Other Ambulatory Visit (HOSPITAL_COMMUNITY): Payer: Self-pay

## 2017-11-21 DIAGNOSIS — F319 Bipolar disorder, unspecified: Secondary | ICD-10-CM

## 2017-11-21 MED ORDER — ALPRAZOLAM 1 MG PO TABS
1.0000 mg | ORAL_TABLET | Freq: Two times a day (BID) | ORAL | 0 refills | Status: DC | PRN
Start: 2017-11-21 — End: 2018-01-13

## 2018-01-11 ENCOUNTER — Ambulatory Visit (HOSPITAL_COMMUNITY): Payer: Commercial Managed Care - PPO | Admitting: Psychiatry

## 2018-01-13 ENCOUNTER — Other Ambulatory Visit (HOSPITAL_COMMUNITY): Payer: Self-pay

## 2018-01-13 DIAGNOSIS — F319 Bipolar disorder, unspecified: Secondary | ICD-10-CM

## 2018-01-13 MED ORDER — ALPRAZOLAM 1 MG PO TABS: 1.0000 mg | ORAL_TABLET | Freq: Two times a day (BID) | ORAL | 0 refills | Status: DC | PRN

## 2018-02-01 ENCOUNTER — Encounter (HOSPITAL_COMMUNITY): Payer: Self-pay | Admitting: Psychiatry

## 2018-02-01 ENCOUNTER — Other Ambulatory Visit: Payer: Self-pay

## 2018-02-01 ENCOUNTER — Ambulatory Visit (INDEPENDENT_AMBULATORY_CARE_PROVIDER_SITE_OTHER): Payer: BLUE CROSS/BLUE SHIELD | Admitting: Psychiatry

## 2018-02-01 VITALS — BP 122/82 | Temp 97.8°F | Ht 66.0 in | Wt 210.8 lb

## 2018-02-01 DIAGNOSIS — F319 Bipolar disorder, unspecified: Secondary | ICD-10-CM | POA: Diagnosis not present

## 2018-02-01 DIAGNOSIS — F413 Other mixed anxiety disorders: Secondary | ICD-10-CM | POA: Diagnosis not present

## 2018-02-01 MED ORDER — LURASIDONE HCL 20 MG PO TABS: 20.0000 mg | ORAL_TABLET | Freq: Every day | ORAL | 0 refills | Status: DC

## 2018-02-01 MED ORDER — QUETIAPINE FUMARATE 400 MG PO TABS: 400.0000 mg | ORAL_TABLET | Freq: Every day | ORAL | 0 refills | Status: DC

## 2018-02-01 MED ORDER — LAMOTRIGINE 200 MG PO TABS: 200.0000 mg | ORAL_TABLET | Freq: Every day | ORAL | 0 refills | Status: DC

## 2018-02-01 NOTE — Progress Notes (Signed)
BH MD/PA/NP OP Progress Note  02/01/2018 10:02 AM Elizabeth LewandowskyMarsha Prince  MRN:  [id number]  Chief Complaint:  Chief Complaint    Follow-up; Medication Refill     HPI: 58 years old currently married Caucasian female coming from [place] diagnosed with bipolar 1 disorder with history of admission being manic in the past.  She is doing reasonably with her current medication she understands she is on 2 antipsychotics she has been a patient of Dr. 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  She understands the memory concerns with Xanax  She is taking 2 antipsychotics Latuda is only 20 mg we have talked about cutting it down spring or summer this year there is no tremors.  She is also planning separation from her husband after 30 years so that it is just negative at home when she goes back from work and she wants to have her look into separation and then go from there  Not impulsive decision  She wants to avoid any panic that may lead to depression with panic attacks leading to mania continues to take Xanax but not regularly  Modifying factors  Job   Duration is more than 15 years Severity of depression it is balanced Visit Diagnosis:    ICD-10-CM   1. Bipolar I disorder (HCC) F31.9 QUEtiapine (SEROQUEL) 400 MG tablet    lurasidone (LATUDA) 20 MG TABS tablet    lamoTRIgine (LAMICTAL) 200 MG tablet  2. 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 Anxiety    situational  . Arthritis    mild arthritis  . Bipolar 1 disorder (HCC)   . History of kidney stones     Past Surgical History:  Procedure Laterality Date  . CYSTOSCOPY/RETROGRADE/URETEROSCOPY/STONE EXTRACTION WITH BASKET Right 10/08/2015   Procedure:  CYSTOSCOPY/RETROGRADE/URETEROSCOPY/STONE EXTRACTION WITH BASKET LASER;  Surgeon: Sebastian Acheheodore Manny, MD;  Location: [facility];  Service: Urology;  Laterality: Right;  . KIDNEY STONE SURGERY    . KNEE ARTHROSCOPY WITH LATERAL MENISECTOMY Left 09/20/2016   Procedure: Left knee arthroscopic partial medial and lateral menisectomy and debridement;  Surgeon: Jene EveryBeane, Jeffrey, MD;  Location: [facility];  Service: Orthopedics;  Laterality: Left;  60 mins  . TONSILLECTOMY       Family History:  Family History  Problem Relation Age of Onset  . Depression Mother   . Depression Maternal Aunt   . Depression Cousin   . Drug abuse Cousin     Social History:  Social History   Socioeconomic History  . Marital status: Married    Spouse name: Not on file  . Number of children: Not on file  . Years of education: Not on file  . Highest education level: Not on file  Occupational History  . Not on file  Social Needs  . Financial resource strain: Not on file  . Food insecurity:    Worry: Not on file    Inability: Not on file  . Transportation needs:    Medical: Not on file    Non-medical: Not on file  Tobacco Use  . Smoking status: Never Smoker  . Smokeless tobacco: Never Used  Substance and Sexual Activity  . Alcohol use: Yes    Alcohol/week: 1.0 standard  drinks    Types: 1 Glasses of wine per week    Comment: occasional social  . Drug use: No  . Sexual activity: Yes    Partners: Male    Birth control/protection: None  Lifestyle  . Physical activity:    Days per week: Not on file    Minutes per session: Not on file  . Stress: Not on file  Relationships  . Social connections:    Talks on phone: Not on file    Gets together: Not on file    Attends religious service: Not on file    Active member of club or organization: Not on file    Attends meetings of clubs or organizations: Not on file    Relationship status: Not on file  Other Topics Concern  . Not on file  Social History Narrative  . Not  on file    Allergies:  Allergies  Allergen Reactions  . Codeine Nausea And Vomiting    Metabolic Disorder Labs: No results found for: HGBA1C, MPG No results found for: PROLACTIN No results found for: CHOL, TRIG, HDL, CHOLHDL, VLDL, LDLCALC No results found for: TSH  Therapeutic Level Labs: No results found for: LITHIUM No results found for: VALPROATE No components found for:  CBMZ  Current Medications: Current Outpatient Medications  Medication Sig Dispense Refill  . ALPRAZolam (XANAX) 1 MG tablet Take 1 tablet (1 mg total) by mouth 2 (two) times daily as needed for anxiety. 60 tablet 0  . cholecalciferol (VITAMIN D) 1000 units tablet Take 1 tablet by mouth at bedtime.    Marland Kitchen ibuprofen (ADVIL,MOTRIN) 200 MG tablet Take 600 mg by mouth every 8 (eight) hours as needed for fever, headache, mild pain, moderate pain or cramping.     . lamoTRIgine (LAMICTAL) 200 MG tablet Take 1 tablet (200 mg total) by mouth at bedtime. 90 tablet 0  . lurasidone (LATUDA) 20 MG TABS tablet Take 1 tablet (20 mg total) by mouth at bedtime. 90 tablet 0  . QUEtiapine (SEROQUEL) 400 MG tablet Take 1 tablet (400 mg total) by mouth at bedtime. 90 tablet 0   No current facility-administered medications for this visit.      Musculoskeletal: Strength & Muscle Tone: within normal limits Gait & Station: normal Patient leans: no lean  Psychiatric Specialty Exam: Review of Systems  Cardiovascular: Negative for chest pain.  Skin: Negative for rash.  Neurological: Negative for tremors.  Psychiatric/Behavioral: Negative for depression.    Blood pressure 122/82, temperature 97.8 F (36.6 C), temperature source Oral, height 5\' 6"  (1.676 m), weight 210 lb 12.8 oz (95.6 kg).Body mass index is 34.02 kg/m.  General Appearance: Casual  Eye Contact:  Good  Speech:  Normal Rate  Volume:  Normal  Mood: fair  Affect:  Congruent  Thought Process:  Goal Directed  Orientation:  Full (Time, Place, and Person)   Thought Content: Logical   Suicidal Thoughts:  No  Homicidal Thoughts:  No  Memory:  Immediate;   Fair Recent;   Fair  Judgement:  Fair  Insight:  Fair  Psychomotor Activity:  Normal  Concentration:  Concentration: Fair and Attention Span: Fair  Recall:  Good  Fund of Knowledge: Good  Language: Good  Akathisia:  No  Handed:  Right  AIMS (if indicated): no abnomral involuntary movements   Assets:  Desire for Improvement Social Support  ADL's:  Intact  Cognition: WNL  Sleep:  Fair   Screenings:   Assessment and Plan: Bipolar disroder, I  Remains stable. Continue meds., on a complex medication regimen no rash with reported side effects she understands she is on 2 antipsychotic but she wants to continue since it has kept her stable for the last few years. Says may consider lowering or dc latuda this year later   Anxiety disorder mixed type.  She takes Xanax 1- 2  a day but is not taking 3 times a day she understands the risks of no drug use or dependence  Considering the stability and awareness of her medication will continue follow-up in 3 months to 6 m,  medications refilled except Xanax that she can call in when she is ready for another refill   Thresa Ross, MD 02/01/2018, 10:02 AM

## 2018-02-17 ENCOUNTER — Other Ambulatory Visit (HOSPITAL_COMMUNITY): Payer: Self-pay | Admitting: Psychiatry

## 2018-02-17 DIAGNOSIS — F319 Bipolar disorder, unspecified: Secondary | ICD-10-CM

## 2018-03-05 ENCOUNTER — Telehealth (HOSPITAL_COMMUNITY): Payer: Self-pay | Admitting: Psychiatry

## 2018-03-05 DIAGNOSIS — F319 Bipolar disorder, unspecified: Secondary | ICD-10-CM

## 2018-03-05 MED ORDER — ALPRAZOLAM 1 MG PO TABS: 1.0000 mg | ORAL_TABLET | Freq: Two times a day (BID) | ORAL | 0 refills | Status: DC | PRN

## 2018-03-05 NOTE — Telephone Encounter (Signed)
Pt needs refill on Xanax  [facility] [place]

## 2018-03-05 NOTE — Telephone Encounter (Signed)
Refill sent.

## 2018-04-21 ENCOUNTER — Other Ambulatory Visit (HOSPITAL_COMMUNITY): Payer: Self-pay

## 2018-04-21 ENCOUNTER — Other Ambulatory Visit (HOSPITAL_COMMUNITY): Payer: Self-pay | Admitting: Psychiatry

## 2018-04-21 DIAGNOSIS — F319 Bipolar disorder, unspecified: Secondary | ICD-10-CM

## 2018-04-21 MED ORDER — ALPRAZOLAM 1 MG PO TABS: 1.0000 mg | ORAL_TABLET | Freq: Two times a day (BID) | ORAL | 0 refills | Status: DC | PRN

## 2018-04-28 ENCOUNTER — Telehealth (HOSPITAL_COMMUNITY): Payer: Self-pay

## 2018-04-28 NOTE — Telephone Encounter (Signed)
Medication management - Telephone message left for pt after she left one that [facility] in [place], [place] did not get the Seroquel order sent in. Informed patient on message this nurse spoke to the pharmacy there in [place] and that they do have all three 90 day orders sent in on 04/23/18 for Lamictal, Latuda, and Seroquel.   Requested patent call back if any problems getting these orders filled.

## 2018-06-05 ENCOUNTER — Other Ambulatory Visit (HOSPITAL_COMMUNITY): Payer: Self-pay | Admitting: Psychiatry

## 2018-06-05 DIAGNOSIS — F319 Bipolar disorder, unspecified: Secondary | ICD-10-CM

## 2018-06-11 ENCOUNTER — Telehealth (HOSPITAL_COMMUNITY): Payer: Self-pay

## 2018-06-11 NOTE — Telephone Encounter (Signed)
Yes it can be called in. Or let me know if I have to send to which pharmacy

## 2018-06-11 NOTE — Telephone Encounter (Signed)
Patient called requesting a refill on her Alprazolam 1mg . The sig is 1 tablet BID PRN. Is it ok to call in #60 0 refills to the pharmacy? Please review and advise. Thank you.

## 2018-06-12 NOTE — Telephone Encounter (Signed)
Called in Alprazolam 1mg  #60 0 refills to [facility] on [address] in [place], [place]. Thank you.

## 2018-07-26 ENCOUNTER — Ambulatory Visit (INDEPENDENT_AMBULATORY_CARE_PROVIDER_SITE_OTHER): Payer: BC Managed Care – PPO | Admitting: Psychiatry

## 2018-07-26 ENCOUNTER — Encounter (HOSPITAL_COMMUNITY): Payer: Self-pay | Admitting: Psychiatry

## 2018-07-26 DIAGNOSIS — F413 Other mixed anxiety disorders: Secondary | ICD-10-CM | POA: Diagnosis not present

## 2018-07-26 DIAGNOSIS — F319 Bipolar disorder, unspecified: Secondary | ICD-10-CM

## 2018-07-26 MED ORDER — LAMOTRIGINE 200 MG PO TABS: 200.0000 mg | ORAL_TABLET | Freq: Every day | ORAL | 0 refills | Status: DC

## 2018-07-26 MED ORDER — LATUDA 20 MG PO TABS: ORAL_TABLET | ORAL | 0 refills | Status: DC

## 2018-07-26 MED ORDER — QUETIAPINE FUMARATE 400 MG PO TABS: 400.0000 mg | ORAL_TABLET | Freq: Every day | ORAL | 0 refills | Status: DC

## 2018-07-26 MED ORDER — ALPRAZOLAM 1 MG PO TABS: 1.0000 mg | ORAL_TABLET | Freq: Two times a day (BID) | ORAL | 0 refills | Status: DC | PRN

## 2018-07-26 NOTE — Progress Notes (Signed)
BH MD/PA/NP OP Progress Note  07/26/2018 1:03 PM Elizabeth Prince  MRN:  [id number]  I connected with Elizabeth LewandowskyMarsha Coykendall on 07/26/18 at 10:00 AM EDT by telephone and verified that I am speaking with the correct person using two identifiers.   I discussed the limitations, risks, security and privacy concerns of performing an evaluation and management service by telephone and the availability of in person appointments. I also discussed with the patient that there may be a patient responsible charge related to this service. The patient expressed understanding and agreed to proceed. Chief Complaint:  Follow up bipolar and anxiety   HPI: 58 years old currently married Caucasian female coming from [place] diagnosed with bipolar 1 disorder with history of admission being manic in the past.  She is doing reasonably with her current medication she understands she is on 2 antipsychotics she has been a patient of Dr. 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 Bipolar I disorder (HCC)  F31.9 ALPRAZolam (XANAX) 1 MG tablet    lamoTRIgine (LAMICTAL) 200 MG tablet    QUEtiapine (SEROQUEL) 400 MG tablet    lurasidone (LATUDA) 20 MG TABS tablet  2. 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 Anxiety    situational  . Arthritis    mild arthritis  . Bipolar 1 disorder (HCC)   . History of kidney stones     Past Surgical History:  Procedure Laterality Date  .  CYSTOSCOPY/RETROGRADE/URETEROSCOPY/STONE EXTRACTION WITH BASKET Right 10/08/2015   Procedure: CYSTOSCOPY/RETROGRADE/URETEROSCOPY/STONE EXTRACTION WITH BASKET LASER;  Surgeon: Sebastian Acheheodore Manny, MD;  Location: [facility];  Service: Urology;  Laterality: Right;  . KIDNEY STONE SURGERY    . KNEE ARTHROSCOPY WITH LATERAL MENISECTOMY Left 09/20/2016   Procedure: Left knee arthroscopic partial medial and lateral menisectomy and debridement;  Surgeon: Jene EveryBeane, Jeffrey, MD;  Location: [facility];  Service: Orthopedics;  Laterality: Left;  60 mins  . TONSILLECTOMY       Family History:  Family History  Problem Relation Age of Onset  . Depression Mother   . Depression Maternal Aunt   . Depression Cousin   . Drug abuse Cousin     Social History:  Social History   Socioeconomic History  . Marital status: Married    Spouse name: Not on file  . Number of children: Not on file  . Years of education: Not on file  . Highest education level: Not on file  Occupational History  . Not on file  Social Needs  . Financial resource strain: Not on file  . Food insecurity    Worry: Not on file    Inability: Not on file  . Transportation needs    Medical: Not on file    Non-medical: Not on file  Tobacco Use  . Smoking status: Never Smoker  . Smokeless tobacco: Never Used  Substance and Sexual Activity  . Alcohol use: Yes  Alcohol/week: 1.0 standard drinks    Types: 1 Glasses of wine per week    Comment: occasional social  . Drug use: No  . Sexual activity: Yes    Partners: Male    Birth control/protection: None  Lifestyle  . Physical activity    Days per week: Not on file    Minutes per session: Not on file  . Stress: Not on file  Relationships  . Social Herbalist on phone: Not on file    Gets together: Not on file    Attends religious service: Not on file    Active member of club or organization: Not on file    Attends meetings of clubs or organizations: Not on file    Relationship  status: Not on file  Other Topics Concern  . Not on file  Social History Narrative  . Not on file    Allergies:  Allergies  Allergen Reactions  . Codeine Nausea And Vomiting    Metabolic Disorder Labs: No results found for: HGBA1C, MPG No results found for: PROLACTIN No results found for: CHOL, TRIG, HDL, CHOLHDL, VLDL, LDLCALC No results found for: TSH  Therapeutic Level Labs: No results found for: LITHIUM No results found for: VALPROATE No components found for:  CBMZ  Current Medications: Current Outpatient Medications  Medication Sig Dispense Refill  . ALPRAZolam (XANAX) 1 MG tablet Take 1 tablet (1 mg total) by mouth 2 (two) times daily as needed for anxiety. 60 tablet 0  . cholecalciferol (VITAMIN D) 1000 units tablet Take 1 tablet by mouth at bedtime.    Marland Kitchen ibuprofen (ADVIL,MOTRIN) 200 MG tablet Take 600 mg by mouth every 8 (eight) hours as needed for fever, headache, mild pain, moderate pain or cramping.     . lamoTRIgine (LAMICTAL) 200 MG tablet Take 1 tablet (200 mg total) by mouth at bedtime. 90 tablet 0  . lurasidone (LATUDA) 20 MG TABS tablet TAKE 1 TABLET BY MOUTH EVERYDAY AT BEDTIME 90 tablet 0  . QUEtiapine (SEROQUEL) 400 MG tablet Take 1 tablet (400 mg total) by mouth at bedtime. 90 tablet 0   No current facility-administered medications for this visit.       Psychiatric Specialty Exam: Review of Systems  Cardiovascular: Negative for chest pain.  Skin: Negative for rash.  Psychiatric/Behavioral: The patient is nervous/anxious.     There were no vitals taken for this visit.There is no height or weight on file to calculate BMI.  General Appearance:   Eye Contact:    Speech:  Normal Rate  Volume:  Normal  Mood: fair somewhat stressed  Affect:  Congruent  Thought Process:  Goal Directed  Orientation:  Full (Time, Place, and Person)  Thought Content: Logical   Suicidal Thoughts:  No  Homicidal Thoughts:  No  Memory:  Immediate;   Fair Recent;   Fair   Judgement:  Fair  Insight:  Fair  Psychomotor Activity:  Normal  Concentration:  Concentration: Fair and Attention Span: Fair  Recall:  Good  Fund of Knowledge: Good  Language: Good  Akathisia:  No  Handed:  Right  AIMS (if indicated): no abnomral involuntary movements   Assets:  Desire for Improvement Social Support  ADL's:  Intact  Cognition: WNL  Sleep:  Fair   Screenings:   Assessment and Plan: Bipolar disroder, I  Remains fair, . Continue meds., on a complex medication regimen no rash with reported side effects she understands she is on 2 antipsychotic but she wants  to continue since it has kept her stable for the last few years. Says may consider lowering or dc latuda this year later But for now wants to keep all meds as pandemic has added some anxiety   Anxiety disorder mixed type. 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 The patient was provided an opportunity to ask questions and all were answered. The patient agreed with the plan and demonstrated an understanding of the instructions.   The patient was advised to call back or seek an in-person evaluation if the symptoms worsen or if the condition fails to improve as anticipated.  Thresa RossNadeem Deborra Phegley, MD 07/26/2018, 1:03 PM

## 2018-07-28 ENCOUNTER — Telehealth (HOSPITAL_COMMUNITY): Payer: Self-pay

## 2018-07-28 DIAGNOSIS — F319 Bipolar disorder, unspecified: Secondary | ICD-10-CM

## 2018-07-28 MED ORDER — ALPRAZOLAM 1 MG PO TABS: 1.0000 mg | ORAL_TABLET | Freq: Two times a day (BID) | ORAL | 0 refills | Status: DC | PRN

## 2018-07-28 NOTE — Telephone Encounter (Signed)
sent 

## 2018-07-28 NOTE — Telephone Encounter (Signed)
Alprazolam was not sent in to [facility] in [place], [place]. It says print on the rx on 07/26/18.

## 2018-09-05 ENCOUNTER — Other Ambulatory Visit (HOSPITAL_COMMUNITY): Payer: Self-pay | Admitting: Psychiatry

## 2018-09-05 ENCOUNTER — Other Ambulatory Visit (HOSPITAL_COMMUNITY): Payer: Self-pay

## 2018-09-05 ENCOUNTER — Telehealth (HOSPITAL_COMMUNITY): Payer: Self-pay | Admitting: Psychiatry

## 2018-09-05 DIAGNOSIS — F319 Bipolar disorder, unspecified: Secondary | ICD-10-CM

## 2018-09-05 MED ORDER — ALPRAZOLAM 1 MG PO TABS: 1.0000 mg | ORAL_TABLET | Freq: Two times a day (BID) | ORAL | 0 refills | Status: DC | PRN

## 2018-09-05 NOTE — Telephone Encounter (Signed)
Pt needs refill on xanax [facility] [place]

## 2018-09-05 NOTE — Telephone Encounter (Signed)
Called pt to make her aware, thank you

## 2018-09-05 NOTE — Telephone Encounter (Signed)
Ok sent!

## 2018-10-13 ENCOUNTER — Other Ambulatory Visit (HOSPITAL_COMMUNITY): Payer: Self-pay | Admitting: Psychiatry

## 2018-10-13 ENCOUNTER — Other Ambulatory Visit (HOSPITAL_COMMUNITY): Payer: Self-pay

## 2018-10-13 DIAGNOSIS — F319 Bipolar disorder, unspecified: Secondary | ICD-10-CM

## 2018-10-13 MED ORDER — ALPRAZOLAM 1 MG PO TABS: 1.0000 mg | ORAL_TABLET | Freq: Two times a day (BID) | ORAL | 0 refills | Status: DC | PRN

## 2018-10-14 ENCOUNTER — Other Ambulatory Visit (HOSPITAL_COMMUNITY): Payer: Self-pay

## 2018-10-18 ENCOUNTER — Other Ambulatory Visit (HOSPITAL_COMMUNITY): Payer: Self-pay | Admitting: Psychiatry

## 2018-10-18 DIAGNOSIS — F319 Bipolar disorder, unspecified: Secondary | ICD-10-CM

## 2018-10-19 ENCOUNTER — Other Ambulatory Visit (HOSPITAL_COMMUNITY): Payer: Self-pay | Admitting: Psychiatry

## 2018-10-19 DIAGNOSIS — F319 Bipolar disorder, unspecified: Secondary | ICD-10-CM

## 2018-10-23 ENCOUNTER — Other Ambulatory Visit (HOSPITAL_COMMUNITY): Payer: Self-pay

## 2018-10-23 DIAGNOSIS — F319 Bipolar disorder, unspecified: Secondary | ICD-10-CM

## 2018-10-23 MED ORDER — LATUDA 20 MG PO TABS: ORAL_TABLET | ORAL | 0 refills | Status: DC

## 2018-10-23 MED ORDER — QUETIAPINE FUMARATE 400 MG PO TABS: 400.0000 mg | ORAL_TABLET | Freq: Every day | ORAL | 0 refills | Status: DC

## 2018-10-23 MED ORDER — LAMOTRIGINE 200 MG PO TABS: 200.0000 mg | ORAL_TABLET | Freq: Every day | ORAL | 0 refills | Status: DC

## 2018-11-14 ENCOUNTER — Other Ambulatory Visit (HOSPITAL_COMMUNITY): Payer: Self-pay

## 2018-11-14 DIAGNOSIS — F319 Bipolar disorder, unspecified: Secondary | ICD-10-CM

## 2018-11-14 MED ORDER — ALPRAZOLAM 1 MG PO TABS: 1.0000 mg | ORAL_TABLET | Freq: Two times a day (BID) | ORAL | 0 refills | Status: DC | PRN

## 2018-12-19 ENCOUNTER — Ambulatory Visit (INDEPENDENT_AMBULATORY_CARE_PROVIDER_SITE_OTHER): Payer: BC Managed Care – PPO | Admitting: Psychiatry

## 2018-12-19 ENCOUNTER — Encounter (HOSPITAL_COMMUNITY): Payer: Self-pay | Admitting: Psychiatry

## 2018-12-19 DIAGNOSIS — F319 Bipolar disorder, unspecified: Secondary | ICD-10-CM | POA: Diagnosis not present

## 2018-12-19 DIAGNOSIS — F413 Other mixed anxiety disorders: Secondary | ICD-10-CM | POA: Diagnosis not present

## 2018-12-19 MED ORDER — LAMOTRIGINE 200 MG PO TABS: 200.0000 mg | ORAL_TABLET | Freq: Every day | ORAL | 1 refills | Status: DC

## 2018-12-19 MED ORDER — ALPRAZOLAM 1 MG PO TABS: 1.0000 mg | ORAL_TABLET | Freq: Two times a day (BID) | ORAL | 4 refills | Status: DC | PRN

## 2018-12-19 MED ORDER — QUETIAPINE FUMARATE 400 MG PO TABS: 400.0000 mg | ORAL_TABLET | Freq: Every day | ORAL | 1 refills | Status: DC

## 2018-12-19 MED ORDER — LATUDA 20 MG PO TABS: ORAL_TABLET | ORAL | 1 refills | Status: DC

## 2018-12-19 NOTE — Progress Notes (Signed)
BH MD/PA/NP OP Progress Note  12/19/2018 11:38 AM Elizabeth Prince  MRN:  [id number]   I connected with [name] on 12/19/18 at 11:30 AM EST by telephone and verified that I am speaking with the correct person using two identifiers.   I discussed the limitations, risks, security and privacy concerns of performing an evaluation and management service by telephone and the availability of in person appointments. I also discussed with the patient that there may be a patient responsible charge related to this service. The patient expressed understanding and agreed to proceed. Chief Complaint:  Follow up bipolar and anxiety   HPI: 58  years old currently married Caucasian female coming from [place] diagnosed with bipolar 1 disorder with history of admission being manic in the past.  She  is on 2 antipsychotics she has been a patient of 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   On two antipsychotics but it works to keep her stable No tremors No impulsive decision  She avoids going into panic mood by her meds   Modifying factors  Job    Duration is more than 15 years Severity of depression it is balanced Visit Diagnosis:    ICD-10-CM   1. Bipolar I disorder (HCC)  F31.9 QUEtiapine (SEROQUEL) 400 MG tablet    lurasidone (LATUDA) 20 MG TABS tablet    lamoTRIgine (LAMICTAL) 200 MG tablet    ALPRAZolam (XANAX) 1 MG tablet  2. 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 Anxiety    situational  . Arthritis    mild arthritis  . Bipolar 1 disorder (HCC)   . History of kidney stones     Past Surgical History:  Procedure Laterality Date  . CYSTOSCOPY/RETROGRADE/URETEROSCOPY/STONE EXTRACTION WITH BASKET Right 10/08/2015   Procedure: CYSTOSCOPY/RETROGRADE/URETEROSCOPY/STONE EXTRACTION WITH BASKET LASER;  Surgeon: [name], MD;   Location: [facility];  Service: Urology;  Laterality: Right;  . KIDNEY STONE SURGERY    . KNEE ARTHROSCOPY WITH LATERAL MENISECTOMY Left 09/20/2016   Procedure: Left knee arthroscopic partial medial and lateral menisectomy and debridement;  Surgeon: [name], MD;  Location: [facility];  Service: Orthopedics;  Laterality: Left;  60 mins  . TONSILLECTOMY       Family History:  Family History  Problem Relation Age of Onset  . Depression Mother   . Depression Maternal Aunt   . Depression Cousin   . Drug abuse Cousin     Social History:  Social History   Socioeconomic History  . Marital status: Married    Spouse name: Not on file  . Number of children: Not on file  . Years of education: Not on file  . Highest education level: Not on file  Occupational History  . Not on file  Social Needs  . Financial resource strain: Not on file  . Food insecurity    Worry: Not on file    Inability: Not on file  . Transportation needs    Medical: Not on file    Non-medical: Not on file  Tobacco Use  . Smoking status: Never Smoker  . Smokeless tobacco: Never Used  Substance and Sexual Activity  . Alcohol use: Yes    Alcohol/week: 1.0 standard drinks    Types: 1 Glasses of wine per week    Comment: occasional social  . Drug use: No  .  Sexual activity: Yes    Partners: Male    Birth control/protection: None  Lifestyle  . Physical activity    Days per week: Not on file    Minutes per session: Not on file  . Stress: Not on file  Relationships  . Social Musicianconnections    Talks on phone: Not on file    Gets together: Not on file    Attends religious service: Not on file    Active member of club or organization: Not on file    Attends meetings of clubs or organizations: Not on file    Relationship status: Not on file  Other Topics Concern  . Not on file  Social History Narrative  . Not on file    Allergies:  Allergies  Allergen Reactions  . Codeine Nausea And Vomiting    Metabolic  Disorder Labs: No results found for: HGBA1C, MPG No results found for: PROLACTIN No results found for: CHOL, TRIG, HDL, CHOLHDL, VLDL, LDLCALC No results found for: TSH  Therapeutic Level Labs: No results found for: LITHIUM No results found for: VALPROATE No components found for:  CBMZ  Current Medications: Current Outpatient Medications  Medication Sig Dispense Refill  . ALPRAZolam (XANAX) 1 MG tablet Take 1 tablet (1 mg total) by mouth 2 (two) times daily as needed for anxiety. 68 tablet 4  . cholecalciferol (VITAMIN D) 1000 units tablet Take 1 tablet by mouth at bedtime.    Marland Kitchen. ibuprofen (ADVIL,MOTRIN) 200 MG tablet Take 600 mg by mouth every 8 (eight) hours as needed for fever, headache, mild pain, moderate pain or cramping.     . lamoTRIgine (LAMICTAL) 200 MG tablet Take 1 tablet (200 mg total) by mouth at bedtime. 90 tablet 1  . lurasidone (LATUDA) 20 MG TABS tablet TAKE 1 TABLET BY MOUTH EVERYDAY AT BEDTIME 90 tablet 1  . QUEtiapine (SEROQUEL) 400 MG tablet Take 1 tablet (400 mg total) by mouth at bedtime. 90 tablet 1   No current facility-administered medications for this visit.       Psychiatric Specialty Exam: Review of Systems  Cardiovascular: Negative for chest pain.  Skin: Negative for rash.    There were no vitals taken for this visit.There is no height or weight on file to calculate BMI.  General Appearance:   Eye Contact:    Speech:  Normal Rate  Volume:  Normal  Mood: better  Affect:  Congruent  Thought Process:  Goal Directed  Orientation:  Full (Time, Place, and Person)  Thought Content: Logical   Suicidal Thoughts:  No  Homicidal Thoughts:  No  Memory:  Immediate;   Fair Recent;   Fair  Judgement:  Fair  Insight:  Fair  Psychomotor Activity:  Normal  Concentration:  Concentration: Fair and Attention Span: Fair  Recall:  Good  Fund of Knowledge: Good  Language: Good  Akathisia:  No  Handed:  Right  AIMS (if indicated): no abnomral involuntary  movements   Assets:  Desire for Improvement Social Support  ADL's:  Intact  Cognition: WNL  Sleep:  Fair   Screenings:   Assessment and Plan: Bipolar disroder, I  Remains far and balanced, continue meds., on a complex medication regimen no rash with reported side effects she understands she is on 2 antipsychotic but she wants to continue since it has kept her stable for the last few years. Says may consider lowering or dc latuda next year, But for now wants to keep all meds as pandemic has added some  anxiety   Anxiety disorder mixed type. Fluctuates, xanax helps, understands to take prn It keeps her doing into panic   Considering the stability and awareness of her medication will continue follow-up in 3 months to 6 m,  medications refilled, refills sent I discussed the assessment and treatment plan with the patient. The patient was provided an opportunity to ask questions and all were answered. The patient agreed with the plan and demonstrated an understanding of the instructions.   The patient was advised to call back or seek an in-person evaluation if the symptoms worsen or if the condition fails to improve as anticipated.  Merian Capron, MD 12/19/2018, 11:38 AM

## 2019-03-04 ENCOUNTER — Telehealth (HOSPITAL_COMMUNITY): Payer: Self-pay | Admitting: Psychiatry

## 2019-03-04 ENCOUNTER — Other Ambulatory Visit: Payer: Self-pay

## 2019-03-04 ENCOUNTER — Ambulatory Visit (INDEPENDENT_AMBULATORY_CARE_PROVIDER_SITE_OTHER): Payer: BC Managed Care – PPO | Admitting: Psychiatry

## 2019-03-04 ENCOUNTER — Encounter (HOSPITAL_COMMUNITY): Payer: Self-pay | Admitting: Psychiatry

## 2019-03-04 DIAGNOSIS — Z566 Other physical and mental strain related to work: Secondary | ICD-10-CM

## 2019-03-04 DIAGNOSIS — F319 Bipolar disorder, unspecified: Secondary | ICD-10-CM | POA: Diagnosis not present

## 2019-03-04 DIAGNOSIS — F413 Other mixed anxiety disorders: Secondary | ICD-10-CM

## 2019-03-04 MED ORDER — LAMOTRIGINE 25 MG PO TABS: 25.0000 mg | ORAL_TABLET | Freq: Two times a day (BID) | ORAL | 0 refills | Status: DC

## 2019-03-04 NOTE — Progress Notes (Signed)
BH MD/PA/NP OP Progress Note  03/04/2019 8:44 AM Elizabeth Prince  MRN:  [id number]    I connected with [name] on 03/04/19 at  8:30 AM EST by telephone and verified that I am speaking with the correct person using two identifiers.    I discussed the limitations, risks, security and privacy concerns of performing an evaluation and management service by telephone and the availability of in person appointments. I also discussed with the patient that there may be a patient responsible charge related to this service. The patient expressed understanding and agreed to proceed. Chief Complaint:  Follow up bipolar and anxiety Early follow up    HPI: 59  years old currently married Caucasian female coming from [place] diagnosed with bipolar 1 disorder with history of admission being manic in the past.  She  is on 2 antipsychotics she has been a patient of [name]  Was doing fair last visit but for last week having episodes of hypomania or elated mood with energy then crash to depression , crying spells, She is able to recognize it early and made appointment. 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 noticed that she gets elated and then into depression quickly She has taken off from work and needs weeks off to stabilize Discussed job stress as main stress and to consider downgrade the stress when ready to go back Patient wants FMLA as well  On two antipsychotics but it works no tremors   No impulsive decision  She avoids going into panic mood by her meds   Modifying factors  : some friends Aggravating factor: seperation, now job stress  Duration is more than 15 years Severity : has gone into depression , withdrawn Does not endorse hopelessness or suicidal toughts  Visit Diagnosis:    ICD-10-CM   1. Bipolar I disorder (HCC)  F31.9   2. 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 Stressful job  Z56.6     Past Psychiatric History: see chart  Past Medical History:   Past Medical History:  Diagnosis Date  . Anxiety    situational  . Arthritis    mild arthritis  . Bipolar 1 disorder (HCC)   . History of kidney stones     Past Surgical History:  Procedure Laterality Date  . CYSTOSCOPY/RETROGRADE/URETEROSCOPY/STONE EXTRACTION WITH BASKET Right 10/08/2015   Procedure: CYSTOSCOPY/RETROGRADE/URETEROSCOPY/STONE EXTRACTION WITH BASKET LASER;  Surgeon: [name], MD;  Location: [facility];  Service: Urology;  Laterality: Right;  . KIDNEY STONE SURGERY    . KNEE ARTHROSCOPY WITH LATERAL MENISECTOMY Left 09/20/2016   Procedure: Left knee arthroscopic partial medial and lateral menisectomy and debridement;  Surgeon: [name], MD;  Location: [facility];  Service: Orthopedics;  Laterality: Left;  60 mins  . TONSILLECTOMY       Family History:  Family History  Problem Relation Age of Onset  . Depression Mother   . Depression Maternal Aunt   . Depression Cousin   . Drug abuse Cousin     Social History:  Social History   Socioeconomic History  . Marital status: Married    Spouse name: Not on file  . Number of children: Not on file  . Years of education: Not on file  . Highest education level: Not on file  Occupational History  . Not on file  Tobacco Use  . Smoking status: Never Smoker  . Smokeless tobacco: Never Used  Substance and Sexual Activity  . Alcohol use: Yes  Alcohol/week: 1.0 standard drinks    Types: 1 Glasses of wine per week    Comment: occasional social  . Drug use: No  . Sexual activity: Yes    Partners: Male    Birth control/protection: None  Other Topics Concern  . Not on file  Social History Narrative  . Not on file   Social Determinants of Health   Financial Resource Strain:   . Difficulty of Paying Living Expenses: Not on file  Food Insecurity:   . Worried About Charity fundraiser in the Last Year: Not on file  . Ran Out of Food in the Last Year: Not on file  Transportation Needs:   . Lack of Transportation  (Medical): Not on file  . Lack of Transportation (Non-Medical): Not on file  Physical Activity:   . Days of Exercise per Week: Not on file  . Minutes of Exercise per Session: Not on file  Stress:   . Feeling of Stress : Not on file  Social Connections:   . Frequency of Communication with Friends and Family: Not on file  . Frequency of Social Gatherings with Friends and Family: Not on file  . Attends Religious Services: Not on file  . Active Member of Clubs or Organizations: Not on file  . Attends Archivist Meetings: Not on file  . Marital Status: Not on file    Allergies:  Allergies  Allergen Reactions  . Codeine Nausea And Vomiting    Metabolic Disorder Labs: No results found for: HGBA1C, MPG No results found for: PROLACTIN No results found for: CHOL, TRIG, HDL, CHOLHDL, VLDL, LDLCALC No results found for: TSH  Therapeutic Level Labs: No results found for: LITHIUM No results found for: VALPROATE No components found for:  CBMZ  Current Medications: Current Outpatient Medications  Medication Sig Dispense Refill  . ALPRAZolam (XANAX) 1 MG tablet Take 1 tablet (1 mg total) by mouth 2 (two) times daily as needed for anxiety. 68 tablet 4  . cholecalciferol (VITAMIN D) 1000 units tablet Take 1 tablet by mouth at bedtime.    Marland Kitchen ibuprofen (ADVIL,MOTRIN) 200 MG tablet Take 600 mg by mouth every 8 (eight) hours as needed for fever, headache, mild pain, moderate pain or cramping.     . lamoTRIgine (LAMICTAL) 200 MG tablet Take 1 tablet (200 mg total) by mouth at bedtime. 90 tablet 1  . lamoTRIgine (LAMICTAL) 25 MG tablet Take 1 tablet (25 mg total) by mouth 2 (two) times daily. This is in addition to 200mg  qhs 60 tablet 0  . lurasidone (LATUDA) 20 MG TABS tablet TAKE 1 TABLET BY MOUTH EVERYDAY AT BEDTIME 90 tablet 1  . QUEtiapine (SEROQUEL) 400 MG tablet Take 1 tablet (400 mg total) by mouth at bedtime. 90 tablet 1   No current facility-administered medications for this  visit.      Psychiatric Specialty Exam: Review of Systems  Cardiovascular: Negative for chest pain.  Skin: Negative for rash.  Psychiatric/Behavioral: Negative for substance abuse and suicidal ideas.    There were no vitals taken for this visit.There is no height or weight on file to calculate BMI.  General Appearance:   Eye Contact:    Speech:  Normal Rate  Volume:  Normal  Mood: stressed, fluctuating mood  Affect:  Congruent  Thought Process:  Goal Directed  Orientation:  Full (Time, Place, and Person)  Thought Content: Logical   Suicidal Thoughts:  No  Homicidal Thoughts:  No  Memory:  Immediate;  Fair Recent;   Fair  Judgement:  Fair  Insight:  Fair  Psychomotor Activity:  Normal  Concentration:  Concentration: Fair and Attention Span: Fair  Recall:  Good  Fund of Knowledge: Good  Language: Good  Akathisia:  No  Handed:  Right  AIMS (if indicated): no abnomral involuntary movements   Assets:  Desire for Improvement Social Support  ADL's:  Intact  Cognition: WNL  Sleep:  Fair   Screenings:   Assessment and Plan: Bipolar disroder, I  : mixed episode but recurrence of more depressive episode. Continue lmcital and meds add 25mg  bid lamictal on top of 200mg  qhs Continue seroquel, latuda No tremors Would need FMLA as she is decompensating and few weeks off from work to stabilize Consider less job responsibility when ready to go back as this is main stressor  Anxiety disorder mixed type. Anxious, on xanax continue . Taking weeks off to stabilize, consider therapy  Has remained stable on 2 antipsychotics, will continue them and add more lamictal Reviewed stressors, provided supportive therapy and to call earlier if needed  FMLA papers and off job paper will be faxed when ready I discussed the assessment and treatment plan with the patient. The patient was provided an opportunity to ask questions and all were answered. The patient agreed with the plan and  demonstrated an understanding of the instructions.   The patient was advised to call back or seek an in-person evaluation if the symptoms worsen or if the condition fails to improve as anticipated. Fu 2 weeks or earlier if needed , MD 03/04/2019, 8:44 AM

## 2019-03-04 NOTE — Telephone Encounter (Signed)
Letter complete and faxed to rebecca

## 2019-03-04 NOTE — Telephone Encounter (Signed)
I send letter, will sign when scanned to me

## 2019-03-04 NOTE — Telephone Encounter (Signed)
Pt called and states she needs a out of work note starting today until 3/29.   Fax it to rebecca hubble at [phone]  I will call patient when it is complete.

## 2019-03-09 ENCOUNTER — Telehealth (HOSPITAL_COMMUNITY): Payer: Self-pay

## 2019-03-09 MED ORDER — QUETIAPINE FUMARATE 50 MG PO TABS: 50.0000 mg | ORAL_TABLET | Freq: Every day | ORAL | 2 refills | Status: DC

## 2019-03-09 MED ORDER — QUETIAPINE FUMARATE 50 MG PO TABS: 50.0000 mg | ORAL_TABLET | Freq: Every day | ORAL | 0 refills | Status: DC

## 2019-03-09 NOTE — Telephone Encounter (Signed)
Patient called stating that she has been having a difficult time sleeping since the Lamictal was increased. She states is even hard for her to sleep after taking Seroquel and Xanax. She is feeling manic and wants to know what to do about not getting sleep at night. Please advise

## 2019-03-09 NOTE — Telephone Encounter (Signed)
She should add seroquel 50mg  more at night, we can send refill to pharmacy  Call back for concerns

## 2019-03-09 NOTE — Telephone Encounter (Signed)
Sent the 50mg  Seroquel to the pharmacy after speaking with the patient. She is more than happy to increase it. Nothing further is needed at this time

## 2019-03-16 ENCOUNTER — Telehealth (HOSPITAL_COMMUNITY): Payer: Self-pay | Admitting: Psychiatry

## 2019-03-16 NOTE — Telephone Encounter (Signed)
Pt states the seroquel 400mg  and the 50mg  was not working. She has started taking seroquel 400mg  and 100mg   It seems to be helping this way. Is this ok?   Please advise.  CB # [phone]  Pt is aware you are out of the office today

## 2019-03-17 NOTE — Telephone Encounter (Signed)
Yes  its ok if its working that way

## 2019-03-17 NOTE — Telephone Encounter (Signed)
Pt is aware.  Nothing Further Needed at this time.

## 2019-03-20 ENCOUNTER — Ambulatory Visit (INDEPENDENT_AMBULATORY_CARE_PROVIDER_SITE_OTHER): Payer: BC Managed Care – PPO | Admitting: Psychiatry

## 2019-03-20 ENCOUNTER — Encounter (HOSPITAL_COMMUNITY): Payer: Self-pay | Admitting: Psychiatry

## 2019-03-20 DIAGNOSIS — F413 Other mixed anxiety disorders: Secondary | ICD-10-CM

## 2019-03-20 DIAGNOSIS — Z566 Other physical and mental strain related to work: Secondary | ICD-10-CM

## 2019-03-20 DIAGNOSIS — F319 Bipolar disorder, unspecified: Secondary | ICD-10-CM

## 2019-03-20 MED ORDER — QUETIAPINE FUMARATE 100 MG PO TABS: 100.0000 mg | ORAL_TABLET | Freq: Every day | ORAL | 0 refills | Status: DC

## 2019-03-20 MED ORDER — LAMOTRIGINE 100 MG PO TABS: 100.0000 mg | ORAL_TABLET | Freq: Every day | ORAL | 0 refills | Status: DC

## 2019-03-20 NOTE — Progress Notes (Signed)
BH MD/PA/NP OP Progress Note  03/20/2019 10:48 AM Elizabeth Prince  MRN:  [id number]     I connected with [name] on 03/20/19 at 10:30 AM EST by telephone and verified that I am speaking with the correct person using two identifiers.    I discussed the limitations, risks, security and privacy concerns of performing an evaluation and management service by telephone and the availability of in person appointments. I also discussed with the patient that there may be a patient responsible charge related to this service. The patient expressed understanding and agreed to proceed. Chief Complaint:  Follow up bipolar and anxiety Early follow up    HPI: 59  years old currently married Caucasian female coming from [place] diagnosed with bipolar 1 disorder with history of admission being manic in the past.  She  is on 2 antipsychotics she has been a patient of [name]  Was stressed and depressed last visit, had to write FMLA and also cyling with hypomania, meds were adjusted  Now at home, still stressed some less cycling and still feels crying during the day, worried about whats going happen to job, start calling for jobs and gets distracted seroquel increase has helped sleep    She still needs time taken off from work and needs weeks off to stabilize No rash on lamictal Trying to get finances in control if cannot go back to work On two antipsychotics but it works no tremors   No impulsive decision  Takes xanax when extra anxiety   Modifying factors  : some friends Aggravating factor: seperation, job related stress Duration 15 years plus Severity : has gone into depression  Does not endorse hopelessness or suicidal toughts  Visit Diagnosis:    ICD-10-CM   1. Bipolar I disorder (HCC)  F31.9   2. 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 Stressful job  Z56.6     Past Psychiatric History: see chart  Past Medical History:  Past Medical History:  Diagnosis Date  . Anxiety     situational  . Arthritis    mild arthritis  . Bipolar 1 disorder (HCC)   . History of kidney stones     Past Surgical History:  Procedure Laterality Date  . CYSTOSCOPY/RETROGRADE/URETEROSCOPY/STONE EXTRACTION WITH BASKET Right 10/08/2015   Procedure: CYSTOSCOPY/RETROGRADE/URETEROSCOPY/STONE EXTRACTION WITH BASKET LASER;  Surgeon: [name], MD;  Location: [facility];  Service: Urology;  Laterality: Right;  . KIDNEY STONE SURGERY    . KNEE ARTHROSCOPY WITH LATERAL MENISECTOMY Left 09/20/2016   Procedure: Left knee arthroscopic partial medial and lateral menisectomy and debridement;  Surgeon: [name], MD;  Location: [facility];  Service: Orthopedics;  Laterality: Left;  60 mins  . TONSILLECTOMY       Family History:  Family History  Problem Relation Age of Onset  . Depression Mother   . Depression Maternal Aunt   . Depression Cousin   . Drug abuse Cousin     Social History:  Social History   Socioeconomic History  . Marital status: Married    Spouse name: Not on file  . Number of children: Not on file  . Years of education: Not on file  . Highest education level: Not on file  Occupational History  . Not on file  Tobacco Use  . Smoking status: Never Smoker  . Smokeless tobacco: Never Used  Substance and Sexual Activity  . Alcohol use: Yes    Alcohol/week: 1.0 standard drinks    Types: 1 Glasses of  wine per week    Comment: occasional social  . Drug use: No  . Sexual activity: Yes    Partners: Male    Birth control/protection: None  Other Topics Concern  . Not on file  Social History Narrative  . Not on file   Social Determinants of Health   Financial Resource Strain:   . Difficulty of Paying Living Expenses: Not on file  Food Insecurity:   . Worried About Charity fundraiser in the Last Year: Not on file  . Ran Out of Food in the Last Year: Not on file  Transportation Needs:   . Lack of Transportation (Medical): Not on file  . Lack of Transportation  (Non-Medical): Not on file  Physical Activity:   . Days of Exercise per Week: Not on file  . Minutes of Exercise per Session: Not on file  Stress:   . Feeling of Stress : Not on file  Social Connections:   . Frequency of Communication with Friends and Family: Not on file  . Frequency of Social Gatherings with Friends and Family: Not on file  . Attends Religious Services: Not on file  . Active Member of Clubs or Organizations: Not on file  . Attends Archivist Meetings: Not on file  . Marital Status: Not on file    Allergies:  Allergies  Allergen Reactions  . Codeine Nausea And Vomiting    Metabolic Disorder Labs: No results found for: HGBA1C, MPG No results found for: PROLACTIN No results found for: CHOL, TRIG, HDL, CHOLHDL, VLDL, LDLCALC No results found for: TSH  Therapeutic Level Labs: No results found for: LITHIUM No results found for: VALPROATE No components found for:  CBMZ  Current Medications: Current Outpatient Medications  Medication Sig Dispense Refill  . ALPRAZolam (XANAX) 1 MG tablet Take 1 tablet (1 mg total) by mouth 2 (two) times daily as needed for anxiety. 68 tablet 4  . cholecalciferol (VITAMIN D) 1000 units tablet Take 1 tablet by mouth at bedtime.    Marland Kitchen ibuprofen (ADVIL,MOTRIN) 200 MG tablet Take 600 mg by mouth every 8 (eight) hours as needed for fever, headache, mild pain, moderate pain or cramping.     . lamoTRIgine (LAMICTAL) 100 MG tablet Take 1 tablet (100 mg total) by mouth daily. This is in addition to 200mg  qhs 30 tablet 0  . lamoTRIgine (LAMICTAL) 200 MG tablet Take 1 tablet (200 mg total) by mouth at bedtime. 90 tablet 1  . lurasidone (LATUDA) 20 MG TABS tablet TAKE 1 TABLET BY MOUTH EVERYDAY AT BEDTIME 90 tablet 1  . QUEtiapine (SEROQUEL) 100 MG tablet Take 1 tablet (100 mg total) by mouth at bedtime. In addition to 400mg  30 tablet 0  . QUEtiapine (SEROQUEL) 400 MG tablet Take 1 tablet (400 mg total) by mouth at bedtime. 90 tablet 1    No current facility-administered medications for this visit.      Psychiatric Specialty Exam: Review of Systems  Cardiovascular: Negative for chest pain.  Skin: Negative for itching.  Psychiatric/Behavioral: Negative for substance abuse and suicidal ideas.    There were no vitals taken for this visit.There is no height or weight on file to calculate BMI.  General Appearance:   Eye Contact:    Speech:  Normal Rate  Volume:  Normal  Mood: stressed  Affect:  Congruent  Thought Process:  Goal Directed  Orientation:  Full (Time, Place, and Person)  Thought Content: Logical   Suicidal Thoughts:  No  Homicidal Thoughts:  No  Memory:  Immediate;   Fair Recent;   Fair  Judgement:  Fair  Insight:  Fair  Psychomotor Activity:  Normal  Concentration:  Concentration: Fair and Attention Span: Fair  Recall:  Good  Fund of Knowledge: Good  Language: Good  Akathisia:  No  Handed:  Right  AIMS (if indicated): no abnomral involuntary movements   Assets:  Desire for Improvement Social Support  ADL's:  Intact  Cognition: WNL  Sleep:  Fair   Screenings:   Assessment and Plan: Bipolar disroder, I  : mixed episode but recurrence of more depressive episode.  Feels stressed at times dysphoric, increase lamictal to 100mg  for day continue 200mg  at night Also seroquel to go 100mg  in addition to 400mg  qhs No tremors Continue leave and work in therapy she has thru her job insurance  Anxiety disorder mixed type.stressed, continue xanax dose and therapy  Reviewed stressors, provided supportive therapy and to call earlier if needed  FMLA papers have been delivered  Remain off work  Fu 2-3 w or earlier  I discussed the assessment and treatment plan with the patient. The patient was provided an opportunity to ask questions and all were answered. The patient agreed with the plan and demonstrated an understanding of the instructions.   The patient was advised to call back or seek an in-person  evaluation if the symptoms worsen or if the condition fails to improve as anticipated.  , MD 03/20/2019, 10:48 AMPatient ID: , female   DOB: 22-Sep-1960, 59 y.o.   MRN: [id number]

## 2019-03-27 ENCOUNTER — Other Ambulatory Visit (HOSPITAL_COMMUNITY): Payer: Self-pay | Admitting: Psychiatry

## 2019-04-08 ENCOUNTER — Telehealth (HOSPITAL_COMMUNITY): Payer: Self-pay | Admitting: Psychiatry

## 2019-04-08 NOTE — Telephone Encounter (Signed)
Pt calling to get a return to work note for Monday. She would like to return to work full duty.   CB [phone]

## 2019-04-08 NOTE — Telephone Encounter (Signed)
Pt called back. She will need a form and not a letter. Will send it to you once received.

## 2019-04-09 NOTE — Telephone Encounter (Signed)
Just sent

## 2019-04-09 NOTE — Telephone Encounter (Signed)
Spoke to patient. verified again that she was ready for Full Duty.  She states she is and does not have any concerns at this time.   Faxed form to [facility] and received confirmation.  Pt is aware.   Nothing Further Needed at this time.

## 2019-04-12 ENCOUNTER — Other Ambulatory Visit (HOSPITAL_COMMUNITY): Payer: Self-pay | Admitting: Psychiatry

## 2019-04-15 ENCOUNTER — Ambulatory Visit (HOSPITAL_COMMUNITY): Payer: BC Managed Care – PPO | Admitting: Psychiatry

## 2019-04-16 ENCOUNTER — Encounter (HOSPITAL_COMMUNITY): Payer: Self-pay | Admitting: Psychiatry

## 2019-04-16 ENCOUNTER — Ambulatory Visit (INDEPENDENT_AMBULATORY_CARE_PROVIDER_SITE_OTHER): Payer: BC Managed Care – PPO | Admitting: Psychiatry

## 2019-04-16 ENCOUNTER — Other Ambulatory Visit: Payer: Self-pay

## 2019-04-16 DIAGNOSIS — F413 Other mixed anxiety disorders: Secondary | ICD-10-CM | POA: Diagnosis not present

## 2019-04-16 DIAGNOSIS — Z566 Other physical and mental strain related to work: Secondary | ICD-10-CM

## 2019-04-16 DIAGNOSIS — F319 Bipolar disorder, unspecified: Secondary | ICD-10-CM

## 2019-04-16 NOTE — Progress Notes (Signed)
BH MD/PA/NP OP Progress Note  04/16/2019 8:49 AM Elizabeth Prince  MRN:  [id number]     I connected with [name] on 04/16/19 at  8:45 AM EDT by telephone and verified that I am speaking with the correct person using two identifiers.   I discussed the limitations, risks, security and privacy concerns of performing an evaluation and management service by telephone and the availability of in person appointments. I also discussed with the patient that there may be a patient responsible charge related to this service. The patient expressed understanding and agreed to proceed. Chief Complaint:  Follow up bipolar and anxiety Early follow up    HPI: 59  years old currently married Caucasian female coming from [place] diagnosed with bipolar 1 disorder with history of admission being manic in the past.  She  is on 2 antipsychotics she has been a patient of 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 Bipolar I disorder (HCC)  F31.9   2. 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 Stressful job  Z56.6     Past Psychiatric History: see chart  Past Medical History:  Past Medical History:  Diagnosis Date  . Anxiety    situational  . Arthritis    mild arthritis  . Bipolar 1 disorder (HCC)   . History of kidney stones     Past Surgical History:  Procedure Laterality Date  . CYSTOSCOPY/RETROGRADE/URETEROSCOPY/STONE EXTRACTION WITH BASKET Right 10/08/2015   Procedure: CYSTOSCOPY/RETROGRADE/URETEROSCOPY/STONE EXTRACTION WITH BASKET  LASER;  Surgeon: [name], MD;  Location: [facility];  Service: Urology;  Laterality: Right;  . KIDNEY STONE SURGERY    . KNEE ARTHROSCOPY WITH LATERAL MENISECTOMY Left 09/20/2016   Procedure: Left knee arthroscopic partial medial and lateral menisectomy and debridement;  Surgeon: [name], MD;  Location: [facility];  Service: Orthopedics;  Laterality: Left;  60 mins  . TONSILLECTOMY       Family History:  Family History  Problem Relation Age of Onset  . Depression Mother   . Depression Maternal Aunt   . Depression Cousin   . Drug abuse Cousin     Social History:  Social History   Socioeconomic History  . Marital status: Married    Spouse name: Not on file  . Number of children: Not on file  . Years of education: Not on file  . Highest education level: Not on file  Occupational History  . Not on file  Tobacco Use  . Smoking status: Never Smoker  . Smokeless tobacco: Never Used  Substance and Sexual Activity  . Alcohol use: Yes    Alcohol/week: 1.0 standard drinks    Types: 1 Glasses of wine per week    Comment: occasional social  . Drug use: No  . Sexual activity: Yes    Partners: Male    Birth control/protection: None  Other Topics Concern  . Not on file  Social History Narrative  . Not on file   Social Determinants of Health   Financial Resource Strain:   .  Difficulty of Paying Living Expenses:   Food Insecurity:   . Worried About Charity fundraiser in the Last Year:   . Arboriculturist in the Last Year:   Transportation Needs:   . Film/video editor (Medical):   Marland Kitchen Lack of Transportation (Non-Medical):   Physical Activity:   . Days of Exercise per Week:   . Minutes of Exercise per Session:   Stress:   . Feeling of Stress :   Social Connections:   . Frequency of Communication with Friends and Family:   . Frequency of Social Gatherings with Friends and Family:   . Attends Religious Services:   . Active Member of Clubs or Organizations:   .  Attends Archivist Meetings:   Marland Kitchen Marital Status:     Allergies:  Allergies  Allergen Reactions  . Codeine Nausea And Vomiting    Metabolic Disorder Labs: No results found for: HGBA1C, MPG No results found for: PROLACTIN No results found for: CHOL, TRIG, HDL, CHOLHDL, VLDL, LDLCALC No results found for: TSH  Therapeutic Level Labs: No results found for: LITHIUM No results found for: VALPROATE No components found for:  CBMZ  Current Medications: Current Outpatient Medications  Medication Sig Dispense Refill  . ALPRAZolam (XANAX) 1 MG tablet Take 1 tablet (1 mg total) by mouth 2 (two) times daily as needed for anxiety. 68 tablet 4  . cholecalciferol (VITAMIN D) 1000 units tablet Take 1 tablet by mouth at bedtime.    Marland Kitchen ibuprofen (ADVIL,MOTRIN) 200 MG tablet Take 600 mg by mouth every 8 (eight) hours as needed for fever, headache, mild pain, moderate pain or cramping.     . lamoTRIgine (LAMICTAL) 100 MG tablet TAKE 1 TABLET BY MOUTH EVERY NIGHT AT BEDTIME IN ADDITION TO 200MG  30 tablet 0  . lamoTRIgine (LAMICTAL) 200 MG tablet Take 1 tablet (200 mg total) by mouth at bedtime. 90 tablet 1  . lurasidone (LATUDA) 20 MG TABS tablet TAKE 1 TABLET BY MOUTH EVERYDAY AT BEDTIME 90 tablet 1  . QUEtiapine (SEROQUEL) 100 MG tablet TAKE 1 TAB BY MOUTH AT BEDTIME IN ADDITION TO 400MG  90 tablet 0  . QUEtiapine (SEROQUEL) 400 MG tablet Take 1 tablet (400 mg total) by mouth at bedtime. 90 tablet 1   No current facility-administered medications for this visit.      Psychiatric Specialty Exam: Review of Systems  Cardiovascular: Negative for chest pain.  Skin: Negative for rash.  Psychiatric/Behavioral: Negative for substance abuse and suicidal ideas.    There were no vitals taken for this visit.There is no height or weight on file to calculate BMI.  General Appearance:   Eye Contact:    Speech:  Normal Rate  Volume:  Normal  Mood: better  Affect:  Congruent  Thought Process:   Goal Directed  Orientation:  Full (Time, Place, and Person)  Thought Content: Logical   Suicidal Thoughts:  No  Homicidal Thoughts:  No  Memory:  Immediate;   Fair Recent;   Fair  Judgement:  Fair  Insight:  Fair  Psychomotor Activity:  Normal  Concentration:  Concentration: Fair and Attention Span: Fair  Recall:  Good  Fund of Knowledge: Good  Language: Good  Akathisia:  No  Handed:  Right  AIMS (if indicated): no abnomral involuntary movements   Assets:  Desire for Improvement Social Support  ADL's:  Intact  Cognition: WNL  Sleep:  Fair   Screenings:   Assessment and Plan: Bipolar disroder, I  :  mixed episode but recurrence of more depressive episode.  Increased seroquel and lamictal has helped, back to job now, planning elsewhere later    Anxiety disorder mixed type: stressed but better, on xanax continue Job stress; ongoing but handling it , planning to apply elsewhere. Continue meds and therapy  Reviewed stressors, provided supportive therapy R   Fu 40m  I discussed the assessment and treatment plan with the patient. The patient was provided an opportunity to ask questions and all were answered. The patient agreed with the plan and demonstrated an understanding of the instructions.   The patient was advised to call back or seek an in-person evaluation if the symptoms worsen or if the condition fails to improve as anticipated. Time spent non face to face time: 15 min. Thresa Ross, MD 04/16/2019, 8:49 AM

## 2019-04-23 ENCOUNTER — Telehealth (HOSPITAL_COMMUNITY): Payer: Self-pay | Admitting: Psychiatry

## 2019-04-23 NOTE — Telephone Encounter (Signed)
Patient calling reporting that her anxiety is really high right now with her job. So many changes right now.  She thinks her meds might need to be adjusted.  She is taking 2 xanax a day and wonders if she should increase to 3 for right now? Or change a medication?  Informed her Dr. Gilmore Laroche is out of the office until Monday. She shows understanding and states she will wait for further instructions when he returns.  Informed her about [facility] or ER if needed.

## 2019-04-27 MED ORDER — BUSPIRONE HCL 7.5 MG PO TABS: 7.5000 mg | ORAL_TABLET | Freq: Every day | ORAL | 0 refills | Status: DC

## 2019-04-27 NOTE — Telephone Encounter (Signed)
I called patient did not pick up. Would not suggest increase xanax due to memory concerns and dependence  I am sending buspar for anxiety once a day and if need can increase to twice a day.  Consider more therapy session or schedule one if not yet scheduled to deal with job anxiety and balance responsibility

## 2019-04-27 NOTE — Telephone Encounter (Signed)
Spoke to patient. Shows understanding.  She will call her current therapist and talk to her about the anxiety and coping skills.   Nothing Further Needed at this time.

## 2019-05-06 ENCOUNTER — Other Ambulatory Visit (HOSPITAL_COMMUNITY): Payer: Self-pay

## 2019-05-06 MED ORDER — LAMOTRIGINE 100 MG PO TABS: ORAL_TABLET | ORAL | 0 refills | Status: DC

## 2019-05-19 ENCOUNTER — Other Ambulatory Visit (HOSPITAL_COMMUNITY): Payer: Self-pay | Admitting: Psychiatry

## 2019-05-29 ENCOUNTER — Other Ambulatory Visit (HOSPITAL_COMMUNITY): Payer: Self-pay | Admitting: Psychiatry

## 2019-06-03 ENCOUNTER — Other Ambulatory Visit (HOSPITAL_COMMUNITY): Payer: Self-pay | Admitting: Psychiatry

## 2019-06-03 DIAGNOSIS — F319 Bipolar disorder, unspecified: Secondary | ICD-10-CM

## 2019-06-16 ENCOUNTER — Telehealth (INDEPENDENT_AMBULATORY_CARE_PROVIDER_SITE_OTHER): Payer: BC Managed Care – PPO | Admitting: Psychiatry

## 2019-06-16 ENCOUNTER — Encounter (HOSPITAL_COMMUNITY): Payer: Self-pay | Admitting: Psychiatry

## 2019-06-16 DIAGNOSIS — F319 Bipolar disorder, unspecified: Secondary | ICD-10-CM

## 2019-06-16 DIAGNOSIS — Z566 Other physical and mental strain related to work: Secondary | ICD-10-CM

## 2019-06-16 DIAGNOSIS — F413 Other mixed anxiety disorders: Secondary | ICD-10-CM | POA: Diagnosis not present

## 2019-06-16 NOTE — Progress Notes (Signed)
[place] MD/PA/NP OP Progress Note  06/16/2019 1:42 PM Elizabeth Prince  MRN:  [id number]     I connected with Tana Felts on 06/16/19 at  1:30 PM EDT by telephone and verified that I am speaking with the correct person using two identifiers.   I discussed the limitations, risks, security and privacy concerns of performing an evaluation and management service by telephone and the availability of in person appointments. I also discussed with the patient that there may be a patient responsible charge related to this service. The patient expressed understanding and agreed to proceed. Chief Complaint:  Follow up bipolar and anxiety Early follow up    HPI: 59  years old currently married Caucasian female coming from [place] diagnosed with bipolar 1 disorder with history of admission being manic in the past.  She  is on 2 antipsychotics she has been a patient of Dr. Luane School  Stressed due to job . She will be informed July 15 of her status. 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 Bipolar I disorder ([place])  F31.9   2. 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 Stressful job  Z56.6     Past Psychiatric History: see chart  Past Medical History:  Past Medical History:  Diagnosis Date  . Anxiety    situational  . Arthritis    mild arthritis  . Bipolar 1 disorder ([place])   . History of  kidney stones     Past Surgical History:  Procedure Laterality Date  . CYSTOSCOPY/RETROGRADE/URETEROSCOPY/STONE EXTRACTION WITH BASKET Right 10/08/2015   Procedure: CYSTOSCOPY/RETROGRADE/URETEROSCOPY/STONE EXTRACTION WITH BASKET LASER;  Surgeon: Alexis Frock, MD;  Location: [facility];  Service: Urology;  Laterality: Right;  . KIDNEY STONE SURGERY    . KNEE ARTHROSCOPY WITH LATERAL MENISECTOMY Left 09/20/2016   Procedure: Left knee arthroscopic partial medial and lateral menisectomy and debridement;  Surgeon: Susa Day, MD;  Location: [facility];  Service: Orthopedics;  Laterality: Left;  60 mins  . TONSILLECTOMY       Family History:  Family History  Problem Relation Age of Onset  . Depression Mother   . Depression Maternal Aunt   . Depression Cousin   . Drug abuse Cousin     Social History:  Social History   Socioeconomic History  . Marital status: Married    Spouse name: Not on file  . Number of children: Not on file  . Years of education: Not on file  . Highest education level: Not on file  Occupational History  . Not on file  Tobacco Use  . Smoking status: Never Smoker  . Smokeless tobacco: Never Used  Substance and Sexual Activity  . Alcohol use: Yes    Alcohol/week: 1.0 standard drinks    Types: 1 Glasses of wine per week    Comment: occasional social  . Drug use:  No  . Sexual activity: Yes    Partners: Male    Birth control/protection: None  Other Topics Concern  . Not on file  Social History Narrative  . Not on file   Social Determinants of Health   Financial Resource Strain:   . Difficulty of Paying Living Expenses:   Food Insecurity:   . Worried About Programme researcher, broadcasting/film/video in the Last Year:   . Barista in the Last Year:   Transportation Needs:   . Freight forwarder (Medical):   [facility] Kitchen Lack of Transportation (Non-Medical):   Physical Activity:   . Days of Exercise per Week:   . Minutes of Exercise per Session:   Stress:   . Feeling of Stress  :   Social Connections:   . Frequency of Communication with Friends and Family:   . Frequency of Social Gatherings with Friends and Family:   . Attends Religious Services:   . Active Member of Clubs or Organizations:   . Attends Banker Meetings:   Marland Kitchen Marital Status:     Allergies:  Allergies  Allergen Reactions  . Codeine Nausea And Vomiting    Metabolic Disorder Labs: No results found for: HGBA1C, MPG No results found for: PROLACTIN No results found for: CHOL, TRIG, HDL, CHOLHDL, VLDL, LDLCALC No results found for: TSH  Therapeutic Level Labs: No results found for: LITHIUM No results found for: VALPROATE No components found for:  CBMZ  Current Medications: Current Outpatient Medications  Medication Sig Dispense Refill  . ALPRAZolam (XANAX) 1 MG tablet TAKE 1 TABLET BY MOUTH TWICE A DAY AS NEEDED FOR ANXIETY 68 tablet 1  . busPIRone (BUSPAR) 7.5 MG tablet TAKE 1 TABLET BY MOUTH EVERY DAY 30 tablet 0  . cholecalciferol (VITAMIN D) 1000 units tablet Take 1 tablet by mouth at bedtime.    [facility] ibuprofen (ADVIL,MOTRIN) 200 MG tablet Take 600 mg by mouth every 8 (eight) hours as needed for fever, headache, mild pain, moderate pain or cramping.     . lamoTRIgine (LAMICTAL) 100 MG tablet TAKE 1 TABLET BY MOUTH EVERY NIGHT AT BEDTIME IN ADDITION TO 200MG  30 tablet 0  . lamoTRIgine (LAMICTAL) 200 MG tablet Take 1 tablet (200 mg total) by mouth at bedtime. 90 tablet 1  . lurasidone (LATUDA) 20 MG TABS tablet TAKE 1 TABLET BY MOUTH EVERYDAY AT BEDTIME 90 tablet 1  . QUEtiapine (SEROQUEL) 100 MG tablet TAKE 1 TAB BY MOUTH AT BEDTIME IN ADDITION TO 400MG  90 tablet 0  . QUEtiapine (SEROQUEL) 400 MG tablet Take 1 tablet (400 mg total) by mouth at bedtime. 90 tablet 1   No current facility-administered medications for this visit.      Psychiatric Specialty Exam: Review of Systems  Cardiovascular: Negative for chest pain.  Skin: Negative for rash.  Psychiatric/Behavioral:  Negative for substance abuse and suicidal ideas. The patient is nervous/anxious.     There were no vitals taken for this visit.There is no height or weight on file to calculate BMI.  General Appearance:   Eye Contact:    Speech:  Normal Rate  Volume:  Normal  Mood: fair  Affect:  Congruent  Thought Process:  Goal Directed  Orientation:  Full (Time, Place, and Person)  Thought Content: Logical   Suicidal Thoughts:  No  Homicidal Thoughts:  No  Memory:  Immediate;   Fair Recent;   Fair  Judgement:  Fair  Insight:  Fair  Psychomotor Activity:  Normal  Concentration:  Concentration: Fair and Attention Span: Fair  Recall:  Good  Fund of Knowledge: Good  Language: Good  Akathisia:  No  Handed:  Right  AIMS (if indicated): no abnomral involuntary movements   Assets:  Desire for Improvement Social Support  ADL's:  Intact  Cognition: WNL  Sleep:  Fair   Screenings:   Assessment and Plan: Bipolar disroder, I  : mixed episode stress related to job but sleeping better, wants to keep seroquel 600mg   .no tremors described Also on latuda and lamictal. Will not increase any further . No rash understands the risk of being on two atypical AP meds but feels it helps and wants to continue  Anxiety disorder mixed type: fluctuates but looking for a job change now. Continue buspar, also have cut down xanax to at night only  Job stress; ongoing but working on applying elsehwere, sleeps better on currnet meds and working in therapy with twice a week for CBT to help with anxiety   Reviewed stressors, provided supportive therapy I discussed the assessment and treatment plan with the patient. The patient was provided an opportunity to ask questions and all were answered. The patient agreed with the plan and demonstrated an understanding of the instructions.   The patient was advised to call back or seek an in-person evaluation if the symptoms worsen or if the condition fails to improve as  anticipated. Time spent non face to face time: 20 min Fu 86m or earlier if needed. Call for refills 0m, MD 06/16/2019, 1:42 PM

## 2019-06-18 ENCOUNTER — Other Ambulatory Visit (HOSPITAL_COMMUNITY): Payer: Self-pay | Admitting: Psychiatry

## 2019-06-18 MED ORDER — BUSPIRONE HCL 7.5 MG PO TABS: 7.5000 mg | ORAL_TABLET | Freq: Every day | ORAL | 0 refills | Status: DC

## 2019-06-18 NOTE — Telephone Encounter (Signed)
sent 

## 2019-06-18 NOTE — Telephone Encounter (Signed)
Pt requesting 70m supply of buspar [facility]  cb [phone]

## 2019-06-19 ENCOUNTER — Ambulatory Visit (HOSPITAL_COMMUNITY): Payer: BC Managed Care – PPO | Admitting: Psychiatry

## 2019-06-23 ENCOUNTER — Other Ambulatory Visit (HOSPITAL_COMMUNITY): Payer: Self-pay

## 2019-06-23 MED ORDER — LAMOTRIGINE 100 MG PO TABS: ORAL_TABLET | ORAL | 0 refills | Status: DC

## 2019-06-24 ENCOUNTER — Other Ambulatory Visit (HOSPITAL_COMMUNITY): Payer: Self-pay

## 2019-06-24 DIAGNOSIS — F319 Bipolar disorder, unspecified: Secondary | ICD-10-CM

## 2019-06-24 MED ORDER — LAMOTRIGINE 200 MG PO TABS: 200.0000 mg | ORAL_TABLET | Freq: Every day | ORAL | 0 refills | Status: DC

## 2019-06-24 MED ORDER — QUETIAPINE FUMARATE 400 MG PO TABS: 400.0000 mg | ORAL_TABLET | Freq: Every day | ORAL | 0 refills | Status: DC

## 2019-07-05 ENCOUNTER — Other Ambulatory Visit (HOSPITAL_COMMUNITY): Payer: Self-pay | Admitting: Psychiatry

## 2019-07-12 ENCOUNTER — Other Ambulatory Visit (HOSPITAL_COMMUNITY): Payer: Self-pay | Admitting: Psychiatry

## 2019-07-12 DIAGNOSIS — F319 Bipolar disorder, unspecified: Secondary | ICD-10-CM

## 2019-07-16 ENCOUNTER — Other Ambulatory Visit (HOSPITAL_COMMUNITY): Payer: Self-pay | Admitting: Psychiatry

## 2019-07-22 ENCOUNTER — Telehealth (HOSPITAL_COMMUNITY): Payer: Self-pay

## 2019-07-22 DIAGNOSIS — F319 Bipolar disorder, unspecified: Secondary | ICD-10-CM

## 2019-07-22 MED ORDER — ALPRAZOLAM 1 MG PO TABS: ORAL_TABLET | ORAL | 1 refills | Status: DC

## 2019-07-22 NOTE — Telephone Encounter (Signed)
sent 

## 2019-07-22 NOTE — Telephone Encounter (Signed)
Patient needs a refill on Xanax sent to [facility] on [address] in [place], [place].[facility] chart.

## 2019-08-21 ENCOUNTER — Telehealth (INDEPENDENT_AMBULATORY_CARE_PROVIDER_SITE_OTHER): Payer: BC Managed Care – PPO | Admitting: Psychiatry

## 2019-08-21 ENCOUNTER — Encounter (HOSPITAL_COMMUNITY): Payer: Self-pay | Admitting: Psychiatry

## 2019-08-21 DIAGNOSIS — Z566 Other physical and mental strain related to work: Secondary | ICD-10-CM

## 2019-08-21 DIAGNOSIS — F319 Bipolar disorder, unspecified: Secondary | ICD-10-CM

## 2019-08-21 DIAGNOSIS — F413 Other mixed anxiety disorders: Secondary | ICD-10-CM

## 2019-08-21 MED ORDER — LAMOTRIGINE 100 MG PO TABS: ORAL_TABLET | ORAL | 0 refills | Status: DC

## 2019-08-21 MED ORDER — LAMOTRIGINE 200 MG PO TABS: 200.0000 mg | ORAL_TABLET | Freq: Every day | ORAL | 0 refills | Status: DC

## 2019-08-21 NOTE — Progress Notes (Signed)
[facility] MD/PA/NP OP Progress Note  08/21/2019 8:52 AM Elizabeth Prince  MRN:  [id number]      I connected with [name] on 08/21/19 at  8:45 AM EDT by telephone and verified that I am speaking with the correct person using two identifiers.    I discussed the limitations, risks, security and privacy concerns of performing an evaluation and management service by telephone and the availability of in person appointments. I also discussed with the patient that there may be a patient responsible charge related to this service. The patient expressed understanding and agreed to proceed.  Patient location home Provider location home  Chief Complaint:  Follow up bipolar and anxiety Early follow up    HPI: 59  years old currently married Caucasian female coming from [place] diagnosed with bipolar 1 disorder with history of admission being manic in the past.  She  is on 2 antipsychotics she has been a patient of [name]  Stressed at last job was adding to her decompensation. 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 Feels improved and less stressed On two antipsychotics but it works no tremors   No impulsive decision  Modifying factors  :some friends, new job Aggravating factor: seperation, last job Duration 20 plus years Severity : varies with job stress   Does not endorse hopelessness or suicidal toughts  Visit Diagnosis:    ICD-10-CM   1. Bipolar I disorder ([facility])  F31.9 lamoTRIgine (LAMICTAL) 200 MG tablet  2. 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 Stressful job  Z56.6     Past Psychiatric History: see chart  Past Medical History:  Past Medical History:  Diagnosis Date  . Anxiety    situational  . Arthritis    mild arthritis  . Bipolar 1 disorder ([facility])   . History of kidney stones     Past Surgical History:  Procedure Laterality Date  .  CYSTOSCOPY/RETROGRADE/URETEROSCOPY/STONE EXTRACTION WITH BASKET Right 10/08/2015   Procedure: CYSTOSCOPY/RETROGRADE/URETEROSCOPY/STONE EXTRACTION WITH BASKET LASER;  Surgeon: [name], MD;  [facility]: [facility];  Service: Urology;  Laterality: Right;  . KIDNEY STONE SURGERY    . KNEE ARTHROSCOPY WITH LATERAL MENISECTOMY Left 09/20/2016   Procedure: Left knee arthroscopic partial medial and lateral menisectomy and debridement;  Surgeon: [name], MD;  [facility]: [facility];  Service: Orthopedics;  Laterality: Left;  60 mins  . TONSILLECTOMY       Family History:  Family History  Problem Relation Age of Onset  . Depression Mother   . Depression Maternal Aunt   . Depression Cousin   . Drug abuse Cousin     Social History:  Social History   Socioeconomic History  . Marital status: Married    Spouse name: Not on file  . Number of children: Not on file  . Years of education: Not on file  . Highest education level: Not on file  Occupational History  . Not on file  Tobacco Use  . Smoking status: Never Smoker  . Smokeless tobacco: Never Used  Vaping Use  . Vaping Use: Never used  Substance and Sexual Activity  . Alcohol use: Yes    Alcohol/week: 1.0 standard drink    Types: 1 Glasses of wine per week    Comment: occasional social  . Drug use: No  . Sexual activity: Yes    Partners: Male  Birth control/protection: None  Other Topics Concern  . Not on file  Social History Narrative  . Not on file   Social Determinants of Health   Financial Resource Strain:   . Difficulty of Paying Living Expenses:   Food Insecurity:   . Worried About Programme researcher, broadcasting/film/video in the Last Year:   . Barista in the Last Year:   Transportation Needs:   . Freight forwarder (Medical):   Marland Kitchen Lack of Transportation (Non-Medical):   Physical Activity:   . Days of Exercise per Week:   . Minutes of Exercise per Session:   Stress:   . Feeling of Stress :   Social Connections:   .  Frequency of Communication with Friends and Family:   . Frequency of Social Gatherings with Friends and Family:   . Attends Religious Services:   . Active Member of Clubs or Organizations:   . Attends Banker Meetings:   Marland Kitchen Marital Status:     Allergies:  Allergies  Allergen Reactions  . Codeine Nausea And Vomiting    Metabolic Disorder Labs: No results found for: HGBA1C, MPG No results found for: PROLACTIN No results found for: CHOL, TRIG, HDL, CHOLHDL, VLDL, LDLCALC No results found for: TSH  Therapeutic Level Labs: No results found for: LITHIUM No results found for: VALPROATE No components found for:  CBMZ  Current Medications: Current Outpatient Medications  Medication Sig Dispense Refill  . ALPRAZolam (XANAX) 1 MG tablet TAKE 1 TABLET BY MOUTH TWICE A DAY AS NEEDED FOR ANXIETY 68 tablet 1  . busPIRone (BUSPAR) 7.5 MG tablet Take 1 tablet (7.5 mg total) by mouth daily. 90 tablet 0  . cholecalciferol (VITAMIN D) 1000 units tablet Take 1 tablet by mouth at bedtime.    [facility] ibuprofen (ADVIL,MOTRIN) 200 MG tablet Take 600 mg by mouth every 8 (eight) hours as needed for fever, headache, mild pain, moderate pain or cramping.     . lamoTRIgine (LAMICTAL) 100 MG tablet Take one a day, in addition to 200mg  30 tablet 0  . lamoTRIgine (LAMICTAL) 200 MG tablet Take 1 tablet (200 mg total) by mouth at bedtime. 90 tablet 0  . LATUDA 20 MG TABS tablet TAKE 1 TABLET BY MOUTH EVERYDAY AT BEDTIME 90 tablet 0  . QUEtiapine (SEROQUEL) 100 MG tablet TAKE 1 TAB BY MOUTH AT BEDTIME IN ADDITION TO 400MG  90 tablet 0  . QUEtiapine (SEROQUEL) 400 MG tablet Take 1 tablet (400 mg total) by mouth at bedtime. 90 tablet 0   No current facility-administered medications for this visit.      Psychiatric Specialty Exam: Review of Systems  Cardiovascular: Negative for chest pain.  Skin: Negative for rash.  Psychiatric/Behavioral: Negative for substance abuse and suicidal ideas.    There were  no vitals taken for this visit.There is no height or weight on file to calculate BMI.  General Appearance:   Eye Contact:    Speech:  Normal Rate  Volume:  Normal  Mood: fair  Affect:  Congruent  Thought Process:  Goal Directed  Orientation:  Full (Time, Place, and Person)  Thought Content: Logical   Suicidal Thoughts:  No  Homicidal Thoughts:  No  Memory:  Immediate;   Fair Recent;   Fair  Judgement:  Fair  Insight:  Fair  Psychomotor Activity:  Normal  Concentration:  Concentration: Fair and Attention Span: Fair  Recall:  Good  Fund of Knowledge: Good  Language: Good  Akathisia:  No  Handed:  Right  AIMS (if indicated): no abnomral involuntary movements   Assets:  Desire for Improvement Social Support  ADL's:  Intact  Cognition: WNL  Sleep:  Fair   Screenings:   Assessment and Plan: Bipolar disroder, I  : mixed episode: improved since got another job and moved  [employer], lamictal, seroquel. No tremors. Understands on multiple meds but keeps balance May cut down seroquel next spring and lamictal  Anxiety disorder mixed type: improved with adding buspar and have cut down xanax mostly to night  Continue therapy   Reviewed stressors, provided supportive therapy I discussed the assessment and treatment plan with the patient. The patient was provided an opportunity to ask questions and all were answered. The patient agreed with the plan and demonstrated an understanding of the instructions.   The patient was advised to call back or seek an in-person evaluation if the symptoms worsen or if the condition fails to improve as anticipated. Time spent non face to face time: 20 min Fu 2-77m. lamictal refills sent. No rash, call for others.  Thresa Ross, MD 08/21/2019, 8:52 AM

## 2019-09-10 ENCOUNTER — Other Ambulatory Visit (HOSPITAL_COMMUNITY): Payer: Self-pay | Admitting: Psychiatry

## 2019-09-16 ENCOUNTER — Other Ambulatory Visit (HOSPITAL_COMMUNITY): Payer: Self-pay

## 2019-09-16 ENCOUNTER — Other Ambulatory Visit (HOSPITAL_COMMUNITY): Payer: Self-pay | Admitting: Psychiatry

## 2019-09-16 MED ORDER — LAMOTRIGINE 100 MG PO TABS: ORAL_TABLET | ORAL | 0 refills | Status: DC

## 2019-09-30 ENCOUNTER — Other Ambulatory Visit (HOSPITAL_COMMUNITY): Payer: Self-pay

## 2019-09-30 DIAGNOSIS — F319 Bipolar disorder, unspecified: Secondary | ICD-10-CM

## 2019-09-30 MED ORDER — QUETIAPINE FUMARATE 400 MG PO TABS: 400.0000 mg | ORAL_TABLET | Freq: Every day | ORAL | 0 refills | Status: DC

## 2019-09-30 MED ORDER — QUETIAPINE FUMARATE 100 MG PO TABS: ORAL_TABLET | ORAL | 0 refills | Status: DC

## 2019-10-05 ENCOUNTER — Other Ambulatory Visit (HOSPITAL_COMMUNITY): Payer: Self-pay

## 2019-10-05 DIAGNOSIS — F319 Bipolar disorder, unspecified: Secondary | ICD-10-CM

## 2019-10-05 MED ORDER — LATUDA 20 MG PO TABS: ORAL_TABLET | ORAL | 0 refills | Status: DC

## 2019-10-07 ENCOUNTER — Encounter (HOSPITAL_COMMUNITY): Payer: Self-pay | Admitting: Psychiatry

## 2019-10-07 ENCOUNTER — Telehealth (INDEPENDENT_AMBULATORY_CARE_PROVIDER_SITE_OTHER): Payer: Self-pay | Admitting: Psychiatry

## 2019-10-07 DIAGNOSIS — F413 Other mixed anxiety disorders: Secondary | ICD-10-CM

## 2019-10-07 DIAGNOSIS — Z566 Other physical and mental strain related to work: Secondary | ICD-10-CM

## 2019-10-07 DIAGNOSIS — F319 Bipolar disorder, unspecified: Secondary | ICD-10-CM

## 2019-10-07 NOTE — Progress Notes (Signed)
[facility] MD/PA/NP OP Progress Note  10/07/2019 10:43 AM Elizabeth Prince  MRN:  [id number]      I connected with Elizabeth Prince on 10/07/19 at  10:30 AM EDT by telephone and verified that I am speaking with the correct person using two identifiers.    I discussed the limitations, risks, security and privacy concerns of performing an evaluation and management service by telephone and the availability of in person appointments. I also discussed with the patient that there may be a patient responsible charge related to this service. The patient expressed understanding and agreed to proceed.  Patient location home Provider location home office  Chief Complaint:  Follow up bipolar and anxiety Early follow up    HPI: 59  years old currently married Caucasian female coming from [place] diagnosed with bipolar 1 disorder with history of admission being manic in the past.  She  is on 2 antipsychotics she has been a patient of [name]  Moved to [facility] work has been less stressful now but had to make this early appointment Forgetting things, feels some fog and gets distracted. Has to write notes and zones out or forget   On two antipsychotics but has been working Now cannot afford Latuda, feels meds may be contributing to it. seroquel and lamictal has been increased over last year    No impulsive decision  Modifying factors  :some friends,new job Aggravating factor: seperation, last job Duration 20 years plus Severity : varies with job stress   Does not endorse hopelessness or suicidal toughts  Visit Diagnosis:    ICD-10-CM   1. Bipolar I disorder ([facility])  F31.9   2. 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 Stressful job  Z56.6     Past Psychiatric History: see chart  Past Medical History:  Past Medical History:  Diagnosis Date  . Anxiety    situational  . Arthritis    mild arthritis  . Bipolar 1 disorder ([facility])   . History of kidney stones     Past Surgical History:   Procedure Laterality Date  . CYSTOSCOPY/RETROGRADE/URETEROSCOPY/STONE EXTRACTION WITH BASKET Right 10/08/2015   Procedure: CYSTOSCOPY/RETROGRADE/URETEROSCOPY/STONE EXTRACTION WITH BASKET LASER;  Surgeon: [name], MD;  Location: [facility];  Service: Urology;  Laterality: Right;  . KIDNEY STONE SURGERY    . KNEE ARTHROSCOPY WITH LATERAL MENISECTOMY Left 09/20/2016   Procedure: Left knee arthroscopic partial medial and lateral menisectomy and debridement;  Surgeon: [name], MD;  [facility];  Service: Orthopedics;  Laterality: Left;  60 mins  . TONSILLECTOMY       Family History:  Family History  Problem Relation Age of Onset  . Depression Mother   . Depression Maternal Aunt   . Depression Cousin   . Drug abuse Cousin     Social History:  Social History   Socioeconomic History  . Marital status: Married    Spouse name: Not on file  . Number of children: Not on file  . Years of education: Not on file  . Highest education level: Not on file  Occupational History  . Not on file  Tobacco Use  . Smoking status: Never Smoker  . Smokeless tobacco: Never Used  Vaping Use  . Vaping Use: Never used  Substance and Sexual Activity  . Alcohol use: Yes    Alcohol/week: 1.0 standard drink    Types: 1 Glasses of wine per week    Comment: occasional social  . Drug use: No  . Sexual  activity: Yes    Partners: Male    Birth control/protection: None  Other Topics Concern  . Not on file  Social History Narrative  . Not on file   Social Determinants of Health   Financial Resource Strain:   . Difficulty of Paying Living Expenses: Not on file  Food Insecurity:   . Worried About Programme researcher, broadcasting/film/video in the Last Year: Not on file  . Ran Out of Food in the Last Year: Not on file  Transportation Needs:   . Lack of Transportation (Medical): Not on file  . Lack of Transportation (Non-Medical): Not on file  Physical Activity:   . Days of Exercise per Week: Not on file  .  Minutes of Exercise per Session: Not on file  Stress:   . Feeling of Stress : Not on file  Social Connections:   . Frequency of Communication with Friends and Family: Not on file  . Frequency of Social Gatherings with Friends and Family: Not on file  . Attends Religious Services: Not on file  . Active Member of Clubs or Organizations: Not on file  . Attends Banker Meetings: Not on file  . Marital Status: Not on file    Allergies:  Allergies  Allergen Reactions  . Codeine Nausea And Vomiting    Metabolic Disorder Labs: No results found for: HGBA1C, MPG No results found for: PROLACTIN No results found for: CHOL, TRIG, HDL, CHOLHDL, VLDL, LDLCALC No results found for: TSH  Therapeutic Level Labs: No results found for: LITHIUM No results found for: VALPROATE No components found for:  CBMZ  Current Medications: Current Outpatient Medications  Medication Sig Dispense Refill  . ALPRAZolam (XANAX) 1 MG tablet TAKE 1 TABLET BY MOUTH TWICE A DAY AS NEEDED FOR ANXIETY 68 tablet 1  . busPIRone (BUSPAR) 7.5 MG tablet TAKE 1 TABLET BY MOUTH EVERY DAY 90 tablet 0  . cholecalciferol (VITAMIN D) 1000 units tablet Take 1 tablet by mouth at bedtime.    [facility] ibuprofen (ADVIL,MOTRIN) 200 MG tablet Take 600 mg by mouth every 8 (eight) hours as needed for fever, headache, mild pain, moderate pain or cramping.     . lamoTRIgine (LAMICTAL) 100 MG tablet Take one a day in addition to 200mg  90 tablet 0  . lamoTRIgine (LAMICTAL) 200 MG tablet Take 1 tablet (200 mg total) by mouth at bedtime. 90 tablet 0  . lurasidone (LATUDA) 20 MG TABS tablet Take 1 tablet by mouth everyday at bedtime 90 tablet 0  . QUEtiapine (SEROQUEL) 100 MG tablet TAKE 1 TAB BY MOUTH AT BEDTIME IN ADDITION TO 400MG  90 tablet 0  . QUEtiapine (SEROQUEL) 400 MG tablet Take 1 tablet (400 mg total) by mouth at bedtime. 90 tablet 0   No current facility-administered medications for this visit.      Psychiatric Specialty  Exam: Review of Systems  Cardiovascular: Negative for chest pain.  Skin: Negative for rash.  Psychiatric/Behavioral: Negative for substance abuse and suicidal ideas.    There were no vitals taken for this visit.There is no height or weight on file to calculate BMI.  General Appearance:   Eye Contact:    Speech:  Normal Rate  Volume:  Normal  Mood: somewhat subdued as job handling getting difficult  Affect:  Congruent  Thought Process:  Goal Directed  Orientation:  Full (Time, Place, and Person)  Thought Content: Logical   Suicidal Thoughts:  No  Homicidal Thoughts:  No  Memory:  Immediate;   Fair  Recent;   Fair  Judgement:  Fair  Insight:  Fair  Psychomotor Activity:  Normal  Concentration:  Concentration: Fair and Attention Span: Fair  Recall:  Good  Fund of Knowledge: Good  Language: Good  Akathisia:  No  Handed:  Right  AIMS (if indicated): no abnomral involuntary movements   Assets:  Desire for Improvement Social Support  ADL's:  Intact  Cognition: WNL  Sleep:  Fair   Screenings:   Assessment and Plan: Bipolar disroder, I  : mixed episode: somewhat stress due to feeling foggy. Can stop Latuda says cannot afford it. Next step would be lower seroquel, will review in 7 days  No tremors  consider neurology to review forgetfullness and causes if any contributing to it.   No tremors. Understands on multiple meds , stop latuda and will review   Anxiety disorder mixed type: stress due to above, avoid extra xanax as it can contribute to fogginess. Continue buspar  Reviewed stressors, provided supportive therapy I discussed the assessment and treatment plan with the patient. The patient was provided an opportunity to ask questions and all were answered. The patient agreed with the plan and demonstrated an understanding of the instructions.   The patient was advised to call back or seek an in-person evaluation if the symptoms worsen or if the condition fails to improve as  anticipated. Time spent non face to face time: 20 min Fu 10 days or earlier  Thresa Ross, MD 10/07/2019, 10:43 AM

## 2019-10-20 ENCOUNTER — Encounter (HOSPITAL_COMMUNITY): Payer: Self-pay | Admitting: Psychiatry

## 2019-10-20 ENCOUNTER — Telehealth (HOSPITAL_COMMUNITY): Payer: Self-pay | Admitting: Psychiatry

## 2019-10-20 ENCOUNTER — Telehealth (INDEPENDENT_AMBULATORY_CARE_PROVIDER_SITE_OTHER): Payer: Self-pay | Admitting: Psychiatry

## 2019-10-20 DIAGNOSIS — Z566 Other physical and mental strain related to work: Secondary | ICD-10-CM

## 2019-10-20 DIAGNOSIS — F413 Other mixed anxiety disorders: Secondary | ICD-10-CM

## 2019-10-20 DIAGNOSIS — F319 Bipolar disorder, unspecified: Secondary | ICD-10-CM

## 2019-10-20 MED ORDER — QUETIAPINE FUMARATE 300 MG PO TABS: 300.0000 mg | ORAL_TABLET | Freq: Every day | ORAL | 0 refills | Status: DC

## 2019-10-20 NOTE — Telephone Encounter (Signed)
sent 

## 2019-10-20 NOTE — Progress Notes (Signed)
BH MD/PA/NP OP Progress Note  10/20/2019 10:56 AM Elizabeth Prince  MRN:  [id number]       I connected with Elizabeth Prince on 10/20/19 at 10:45 AM EDT by telephone and verified that I am speaking with the correct person using two identifiers.  I discussed the limitations, risks, security and privacy concerns of performing an evaluation and management service by telephone and the availability of in person appointments. I also discussed with the patient that there may be a patient responsible charge related to this service. The patient expressed understanding and agreed to proceed.  Patient location home Provider location home office  Chief Complaint:  Follow up bipolar and anxiety Early follow up    HPI: 59  years old currently married Caucasian female coming from [place] diagnosed with bipolar 1 disorder with history of admission being manic in the past.  She  is on 2 antipsychotics she has been a patient of [name]  Moved to [facility] work was less stressful, but forgetting things, getting distracted, zones out, feel cloudy Last visit have recommended to stop latuda and lower seroquel, has helped some but still gets distracted and cannot remember things Is also on xanax   On lamictal for mood No rash Having difficulty at job so now working remotely but still struggling and fear of decompensation unless more accomodations are not done   No impulsive decision  Modifying factors  Some friends,new job Aggravating factor: seperation,last job Duration 20 years plus Severity : varies with job stress   Does not endorse hopelessness or suicidal toughts  Visit Diagnosis:    ICD-10-CM   1. Bipolar I disorder (HCC)  F31.9   2. 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 Stressful job  Z56.6     Past Psychiatric History: see chart  Past Medical History:  Past Medical History:  Diagnosis Date  . Anxiety    situational  . Arthritis    mild arthritis  . Bipolar 1 disorder (HCC)    . History of kidney stones     Past Surgical History:  Procedure Laterality Date  . CYSTOSCOPY/RETROGRADE/URETEROSCOPY/STONE EXTRACTION WITH BASKET Right 10/08/2015   Procedure: CYSTOSCOPY/RETROGRADE/URETEROSCOPY/STONE EXTRACTION WITH BASKET LASER;  Surgeon: [name], MD;  Location: [facility];  Service: Urology;  Laterality: Right;  . KIDNEY STONE SURGERY    . KNEE ARTHROSCOPY WITH LATERAL MENISECTOMY Left 09/20/2016   Procedure: Left knee arthroscopic partial medial and lateral menisectomy and debridement;  Surgeon: [name], MD;  Location: [facility];  Service: Orthopedics;  Laterality: Left;  60 mins  . TONSILLECTOMY       Family History:  Family History  Problem Relation Age of Onset  . Depression Mother   . Depression Maternal Aunt   . Depression Cousin   . Drug abuse Cousin     Social History:  Social History   Socioeconomic History  . Marital status: Married    Spouse name: Not on file  . Number of children: Not on file  . Years of education: Not on file  . Highest education level: Not on file  Occupational History  . Not on file  Tobacco Use  . Smoking status: Never Smoker  . Smokeless tobacco: Never Used  Vaping Use  . Vaping Use: Never used  Substance and Sexual Activity  . Alcohol use: Yes    Alcohol/week: 1.0 standard drink    Types: 1 Glasses of wine per week    Comment: occasional social  . Drug use:  No  . Sexual activity: Yes    Partners: Male    Birth control/protection: None  Other Topics Concern  . Not on file  Social History Narrative  . Not on file   Social Determinants of Health   Financial Resource Strain:   . Difficulty of Paying Living Expenses: Not on file  Food Insecurity:   . Worried About Programme researcher, broadcasting/film/video in the Last Year: Not on file  . Ran Out of Food in the Last Year: Not on file  Transportation Needs:   . Lack of Transportation (Medical): Not on file  . Lack of Transportation (Non-Medical): Not on file  Physical  Activity:   . Days of Exercise per Week: Not on file  . Minutes of Exercise per Session: Not on file  Stress:   . Feeling of Stress : Not on file  Social Connections:   . Frequency of Communication with Friends and Family: Not on file  . Frequency of Social Gatherings with Friends and Family: Not on file  . Attends Religious Services: Not on file  . Active Member of Clubs or Organizations: Not on file  . Attends Banker Meetings: Not on file  . Marital Status: Not on file    Allergies:  Allergies  Allergen Reactions  . Codeine Nausea And Vomiting    Metabolic Disorder Labs: No results found for: HGBA1C, MPG No results found for: PROLACTIN No results found for: CHOL, TRIG, HDL, CHOLHDL, VLDL, LDLCALC No results found for: TSH  Therapeutic Level Labs: No results found for: LITHIUM No results found for: VALPROATE No components found for:  CBMZ  Current Medications: Current Outpatient Medications  Medication Sig Dispense Refill  . ALPRAZolam (XANAX) 1 MG tablet TAKE 1 TABLET BY MOUTH TWICE A DAY AS NEEDED FOR ANXIETY 68 tablet 1  . busPIRone (BUSPAR) 7.5 MG tablet TAKE 1 TABLET BY MOUTH EVERY DAY 90 tablet 0  . cholecalciferol (VITAMIN D) 1000 units tablet Take 1 tablet by mouth at bedtime.    Marland Kitchen ibuprofen (ADVIL,MOTRIN) 200 MG tablet Take 600 mg by mouth every 8 (eight) hours as needed for fever, headache, mild pain, moderate pain or cramping.     . lamoTRIgine (LAMICTAL) 100 MG tablet Take one a day in addition to 200mg  90 tablet 0  . lamoTRIgine (LAMICTAL) 200 MG tablet Take 1 tablet (200 mg total) by mouth at bedtime. 90 tablet 0  . QUEtiapine (SEROQUEL) 100 MG tablet TAKE 1 TAB BY MOUTH AT BEDTIME IN ADDITION TO 400MG  90 tablet 0  . QUEtiapine (SEROQUEL) 400 MG tablet Take 1 tablet (400 mg total) by mouth at bedtime. 90 tablet 0   No current facility-administered medications for this visit.      Psychiatric Specialty Exam: Review of Systems   Cardiovascular: Negative for chest pain.  Skin: Negative for rash.  Psychiatric/Behavioral: Positive for memory loss. Negative for substance abuse and suicidal ideas. The patient is nervous/anxious.     There were no vitals taken for this visit.There is no height or weight on file to calculate BMI.  General Appearance:   Eye Contact:    Speech:  Normal Rate  Volume:  Normal  Mood: subdued or stressed due to job stress completion   Affect:  Congruent  Thought Process:  Goal Directed  Orientation:  Full (Time, Place, and Person)  Thought Content: Logical   Suicidal Thoughts:  No  Homicidal Thoughts:  No  Memory:  Immediate;   Fair Recent;   Fair  Judgement:  Fair  Insight:  Fair  Psychomotor Activity:  Normal  Concentration:  Concentration: Fair and Attention Span: Fair  Recall:  Good  Fund of Knowledge: Good  Language: Good  Akathisia:  No  Handed:  Right  AIMS (if indicated): no abnomral involuntary movements   Assets:  Desire for Improvement Social Support  ADL's:  Intact  Cognition: WNL  Sleep:  Fair   Screenings:   Assessment and Plan: Bipolar disroder, I  : mixed episode ; gets foggy and distracted, will adjust to more accommodations lower work hours to 4 and keep remote working Lower xanax slowly as it may be contributing to memory concerns and foggines Lower seroquel to 300 mg from 400mg   consider neurology to review forgetfullness and causes if any contributing to it.   No tremors. Understands on multiple meds   Anxiety disorder mixed type: stress due to above, lower xanax slowly to not effect memory Lower work hours to 4 and reassess temporary change within 69months Reviewed stressors, provided supportive therapy I discussed the assessment and treatment plan with the patient. The patient was provided an opportunity to ask questions and all were answered. The patient agreed with the plan and demonstrated an understanding of the instructions.   The patient was  advised to call back or seek an in-person evaluation if the symptoms worsen or if the condition fails to improve as anticipated. Time spent non face to face time: 20 min Fu 3-4 weeks or earlier I fneeded Will write paper work for accommodations 1month, MD 10/20/2019, 10:56 AM

## 2019-10-20 NOTE — Telephone Encounter (Signed)
Pt needs the new 300mg  seroquel rx called into [facility] [place].

## 2019-11-12 ENCOUNTER — Other Ambulatory Visit (HOSPITAL_COMMUNITY): Payer: Self-pay | Admitting: Psychiatry

## 2019-11-19 ENCOUNTER — Encounter (HOSPITAL_COMMUNITY): Payer: Self-pay | Admitting: Psychiatry

## 2019-11-19 ENCOUNTER — Telehealth (INDEPENDENT_AMBULATORY_CARE_PROVIDER_SITE_OTHER): Payer: Self-pay | Admitting: Psychiatry

## 2019-11-19 DIAGNOSIS — F319 Bipolar disorder, unspecified: Secondary | ICD-10-CM

## 2019-11-19 DIAGNOSIS — F413 Other mixed anxiety disorders: Secondary | ICD-10-CM

## 2019-11-19 MED ORDER — QUETIAPINE FUMARATE 300 MG PO TABS
ORAL_TABLET | ORAL | 0 refills | Status: DC
Start: 2019-11-19 — End: 2019-12-07

## 2019-11-19 MED ORDER — ALPRAZOLAM 0.5 MG PO TABS: ORAL_TABLET | ORAL | 0 refills | Status: DC

## 2019-11-19 MED ORDER — LAMOTRIGINE 200 MG PO TABS: 200.0000 mg | ORAL_TABLET | Freq: Every day | ORAL | 0 refills | Status: DC

## 2019-11-19 MED ORDER — BUSPIRONE HCL 7.5 MG PO TABS
7.5000 mg | ORAL_TABLET | Freq: Every day | ORAL | 0 refills | Status: DC
Start: 2019-11-19 — End: 2020-01-25

## 2019-11-19 NOTE — Progress Notes (Signed)
BH MD/PA/NP OP Progress Note  11/19/2019 10:40 AM Elizabeth Prince  MRN:  [id number]       I connected with Elizabeth Prince on 11/19/19 at 10:30 AM EDT by telephone and verified that I am speaking with the correct person using two identifiers.  I discussed the limitations, risks, security and privacy concerns of performing an evaluation and management service by telephone and the availability of in person appointments. I also discussed with the patient that there may be a patient responsible charge related to this service. The patient expressed understanding and agreed to proceed.  Patient location home Provider location home office  Chief Complaint:  Follow up bipolar and anxiety    HPI: 59  years old currently married Caucasian female , Moved to [place] work   Was forgetting things, distracted and couldn't tolerate job stress, they did not give accommodations Now fired  Doing better now, less stressed, seroquel and lamictal are now cut down  Also taking half of 1mg  xanax at night for sleep  Was on higher doses Feels less forgetful   No rash   No impulsive decision  Modifying factors  Some friends  Aggravating factor: seperation, last job Duration 20 years plus Severity : was high when working  Does not endorse hopelessness or suicidal toughts  Visit Diagnosis:    ICD-10-CM   1. 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 Bipolar I disorder (HCC)  F31.9 lamoTRIgine (LAMICTAL) 200 MG tablet    ALPRAZolam (XANAX) 0.5 MG tablet    Past Psychiatric History: see chart  Past Medical History:  Past Medical History:  Diagnosis Date  . Anxiety    situational  . Arthritis    mild arthritis  . Bipolar 1 disorder (HCC)   . History of kidney stones     Past Surgical History:  Procedure Laterality Date  . CYSTOSCOPY/RETROGRADE/URETEROSCOPY/STONE EXTRACTION WITH BASKET Right 10/08/2015   Procedure: CYSTOSCOPY/RETROGRADE/URETEROSCOPY/STONE EXTRACTION WITH BASKET LASER;   Surgeon: 10/10/2015, MD;  Location: [facility];  Service: Urology;  Laterality: Right;  . KIDNEY STONE SURGERY    . KNEE ARTHROSCOPY WITH LATERAL MENISECTOMY Left 09/20/2016   Procedure: Left knee arthroscopic partial medial and lateral menisectomy and debridement;  Surgeon: 11/20/2016, MD;  Location: [facility];  Service: Orthopedics;  Laterality: Left;  60 mins  . TONSILLECTOMY       Family History:  Family History  Problem Relation Age of Onset  . Depression Mother   . Depression Maternal Aunt   . Depression Cousin   . Drug abuse Cousin     Social History:  Social History   Socioeconomic History  . Marital status: Married    Spouse name: Not on file  . Number of children: Not on file  . Years of education: Not on file  . Highest education level: Not on file  Occupational History  . Not on file  Tobacco Use  . Smoking status: Never Smoker  . Smokeless tobacco: Never Used  Vaping Use  . Vaping Use: Never used  Substance and Sexual Activity  . Alcohol use: Yes    Alcohol/week: 1.0 standard drink    Types: 1 Glasses of wine per week    Comment: occasional social  . Drug use: No  . Sexual activity: Yes    Partners: Male    Birth control/protection: None  Other Topics Concern  . Not on file  Social History Narrative  . Not on file   Social Determinants of Health  Financial Resource Strain:   . Difficulty of Paying Living Expenses: Not on file  Food Insecurity:   . Worried About Programme researcher, broadcasting/film/video in the Last Year: Not on file  . Ran Out of Food in the Last Year: Not on file  Transportation Needs:   . Lack of Transportation (Medical): Not on file  . Lack of Transportation (Non-Medical): Not on file  Physical Activity:   . Days of Exercise per Week: Not on file  . Minutes of Exercise per Session: Not on file  Stress:   . Feeling of Stress : Not on file  Social Connections:   . Frequency of Communication with Friends and Family: Not on file  . Frequency of  Social Gatherings with Friends and Family: Not on file  . Attends Religious Services: Not on file  . Active Member of Clubs or Organizations: Not on file  . Attends Banker Meetings: Not on file  . Marital Status: Not on file    Allergies:  Allergies  Allergen Reactions  . Codeine Nausea And Vomiting    Metabolic Disorder Labs: No results found for: HGBA1C, MPG No results found for: PROLACTIN No results found for: CHOL, TRIG, HDL, CHOLHDL, VLDL, LDLCALC No results found for: TSH  Therapeutic Level Labs: No results found for: LITHIUM No results found for: VALPROATE No components found for:  CBMZ  Current Medications: Current Outpatient Medications  Medication Sig Dispense Refill  . ALPRAZolam (XANAX) 0.5 MG tablet Taking at night. Was taking 1mg  bid last year, have tapered 30 tablet 0  . busPIRone (BUSPAR) 7.5 MG tablet Take 1 tablet (7.5 mg total) by mouth daily. 90 tablet 0  . cholecalciferol (VITAMIN D) 1000 units tablet Take 1 tablet by mouth at bedtime.    ibuprofen (ADVIL,MOTRIN) 200 MG tablet Take 600 mg by mouth every 8 (eight) hours as needed for fever, headache, mild pain, moderate pain or cramping.     . lamoTRIgine (LAMICTAL) 200 MG tablet Take 1 tablet (200 mg total) by mouth at bedtime. 90 tablet 0  . QUEtiapine (SEROQUEL) 300 MG tablet TAKE 1 TABLET BY MOUTH EVERYDAY AT BEDTIME 30 tablet 0   No current facility-administered medications for this visit.      Psychiatric Specialty Exam: Review of Systems  Cardiovascular: Negative for chest pain.  Skin: Negative for rash.  Psychiatric/Behavioral: Negative for substance abuse and suicidal ideas.    There were no vitals taken for this visit.There is no height or weight on file to calculate BMI.  General Appearance:   Eye Contact:    Speech:  Normal Rate  Volume:  Normal  Mood: better  Affect:  Congruent  Thought Process:  Goal Directed  Orientation:  Full (Time, Place, and Person)  Thought  Content: Logical   Suicidal Thoughts:  No  Homicidal Thoughts:  No  Memory:  Immediate;   Fair Recent;   Fair  Judgement:  Fair  Insight:  Fair  Psychomotor Activity:  Normal  Concentration:  Concentration: Fair and Attention Span: Fair  Recall:  Good  Fund of Knowledge: Good  Language: Good  Akathisia:  No  Handed:  Right  AIMS (if indicated): no abnomral involuntary movements   Assets:  Desire for Improvement Social Support  ADL's:  Intact  Cognition: WNL  Sleep:  Fair   Screenings:   Assessment and Plan: Bipolar disroder, I  : mixed episode ; better, continue seroquel 300mg , lamictal 200mg  Lower xanax slowly now on half can take  for night for now    No tremors. Understands on multiple meds   Anxiety disorder mixed type: stress level better since left job, has financial support and serveregince package Reviewed stressors, provided supportive therapy I discussed the assessment and treatment plan with the patient. The patient was provided an opportunity to ask questions and all were answered. The patient agreed with the plan and demonstrated an understanding of the instructions.   The patient was advised to call back or seek an in-person evaluation if the symptoms worsen or if the condition fails to improve as anticipated. Time spent non face to face time: 15 min Fu 2 m. Renewed meds Thresa Ross, MD 11/19/2019, 10:40 AM

## 2019-11-24 ENCOUNTER — Telehealth (HOSPITAL_COMMUNITY): Payer: Self-pay | Admitting: Psychiatry

## 2019-12-07 ENCOUNTER — Other Ambulatory Visit (HOSPITAL_COMMUNITY): Payer: Self-pay

## 2019-12-07 MED ORDER — QUETIAPINE FUMARATE 300 MG PO TABS: ORAL_TABLET | ORAL | 0 refills | Status: DC

## 2020-01-19 ENCOUNTER — Telehealth (HOSPITAL_COMMUNITY): Payer: Self-pay | Admitting: Psychiatry

## 2020-01-25 ENCOUNTER — Telehealth (INDEPENDENT_AMBULATORY_CARE_PROVIDER_SITE_OTHER): Payer: BC Managed Care – PPO | Admitting: Psychiatry

## 2020-01-25 ENCOUNTER — Encounter (HOSPITAL_COMMUNITY): Payer: Self-pay | Admitting: Psychiatry

## 2020-01-25 DIAGNOSIS — F319 Bipolar disorder, unspecified: Secondary | ICD-10-CM

## 2020-01-25 DIAGNOSIS — F413 Other mixed anxiety disorders: Secondary | ICD-10-CM

## 2020-01-25 MED ORDER — BUSPIRONE HCL 7.5 MG PO TABS
7.5000 mg | ORAL_TABLET | Freq: Every day | ORAL | 0 refills | Status: DC
Start: 2020-01-25 — End: 2020-03-10

## 2020-01-25 NOTE — Progress Notes (Signed)
BH MD/PA/NP OP Progress Note  01/25/2020 11:53 AM Elizabeth Prince  MRN:  [id number]       I connected with [name] on 01/25/20 at 11:30 AM EST by telephone and verified that I am speaking with the correct person using two identifiers.  I discussed the limitations, risks, security and privacy concerns of performing an evaluation and management service by telephone and the availability of in person appointments. I also discussed with the patient that there may be a patient responsible charge related to this service. The patient expressed understanding and agreed to proceed.  Patient location home Provider location home office  Chief Complaint:  Follow up bipolar and anxiety    HPI: 60  years old currently married Caucasian female , Moved to [place] work   Couldn't handle last job stress was getting forgetful.  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 Bipolar I disorder (HCC)  F31.9   2. 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 Anxiety    situational  . Arthritis    mild arthritis  . Bipolar 1 disorder (HCC)   . History of kidney stones     Past Surgical History:  Procedure Laterality Date  . CYSTOSCOPY/RETROGRADE/URETEROSCOPY/STONE EXTRACTION WITH BASKET Right 10/08/2015   Procedure: CYSTOSCOPY/RETROGRADE/URETEROSCOPY/STONE  EXTRACTION WITH BASKET LASER;  Surgeon: [name], MD;  Location: [facility];  Service: Urology;  Laterality: Right;  . KIDNEY STONE SURGERY    . KNEE ARTHROSCOPY WITH LATERAL MENISECTOMY Left 09/20/2016   Procedure: Left knee arthroscopic partial medial and lateral menisectomy and debridement;  Surgeon: [name], MD;  Location: [facility];  Service: Orthopedics;  Laterality: Left;  60 mins  . TONSILLECTOMY       Family History:  Family History  Problem Relation Age of Onset  . Depression Mother   . Depression Maternal Aunt   . Depression Cousin   . Drug abuse Cousin     Social History:  Social History   Socioeconomic History  . Marital status: Married    Spouse name: Not on file  . Number of children: Not on file  . Years of education: Not on file  . Highest education level: Not on file  Occupational History  . Not on file  Tobacco Use  . Smoking status: Never Smoker  . Smokeless tobacco: Never Used  Vaping Use  . Vaping Use: Never used  Substance and Sexual Activity  . Alcohol use: Yes    Alcohol/week: 1.0 standard drink    Types: 1 Glasses of wine per week    Comment: occasional social  . Drug use: No  . Sexual activity: Yes    Partners: Male    Birth control/protection: None  Other Topics Concern  . Not on file  Social History Narrative  . Not on file  Social Determinants of Health   Financial Resource Strain: Not on file  Food Insecurity: Not on file  Transportation Needs: Not on file  Physical Activity: Not on file  Stress: Not on file  Social Connections: Not on file    Allergies:  Allergies  Allergen Reactions  . Codeine Nausea And Vomiting    Metabolic Disorder Labs: No results found for: HGBA1C, MPG No results found for: PROLACTIN No results found for: CHOL, TRIG, HDL, CHOLHDL, VLDL, LDLCALC No results found for: TSH  Therapeutic Level Labs: No results found for: LITHIUM No results found for: VALPROATE No components found for:   CBMZ  Current Medications: Current Outpatient Medications  Medication Sig Dispense Refill  . ALPRAZolam (XANAX) 0.5 MG tablet Taking at night. Was taking 1mg  bid last year, have tapered 30 tablet 0  . busPIRone (BUSPAR) 7.5 MG tablet Take 1 tablet (7.5 mg total) by mouth daily. 90 tablet 0  . cholecalciferol (VITAMIN D) 1000 units tablet Take 1 tablet by mouth at bedtime.    ibuprofen (ADVIL,MOTRIN) 200 MG tablet Take 600 mg by mouth every 8 (eight) hours as needed for fever, headache, mild pain, moderate pain or cramping.     . lamoTRIgine (LAMICTAL) 200 MG tablet Take 1 tablet (200 mg total) by mouth at bedtime. 90 tablet 0  . QUEtiapine (SEROQUEL) 300 MG tablet TAKE 1 TABLET BY MOUTH EVERYDAY AT BEDTIME 90 tablet 0   No current facility-administered medications for this visit.      Psychiatric Specialty Exam: Review of Systems  Cardiovascular: Negative for chest pain.  Skin: Negative for rash.  Psychiatric/Behavioral: Negative for substance abuse and suicidal ideas.    There were no vitals taken for this visit.There is no height or weight on file to calculate BMI.  General Appearance:   Eye Contact:    Speech:  Normal Rate  Volume:  Normal  Mood: better  Affect:  Congruent  Thought Process:  Goal Directed  Orientation:  Full (Time, Place, and Person)  Thought Content: Logical   Suicidal Thoughts:  No  Homicidal Thoughts:  No  Memory:  Immediate;   Fair Recent;   Fair  Judgement:  Fair  Insight:  Fair  Psychomotor Activity:  Normal  Concentration:  Concentration: Fair and Attention Span: Fair  Recall:  Good  Fund of Knowledge: Good  Language: Good  Akathisia:  No  Handed:  Right  AIMS (if indicated): no abnomral involuntary movements   Assets:  Desire for Improvement Social Support  ADL's:  Intact  Cognition: WNL  Sleep:  Fair   Screenings:   Assessment and Plan: Bipolar disroder, I  : mixed episode ;better, continue lamictal, seroquel Understands to keep  a balance job    No tremors.   Anxiety disorder mixed type: buspar hellps stress, have restarted, continue meds including seroquel Discussed to find provider in [place] as she has moved there, we will provide refills for now Possible discharge after next visit  Reviewed stressors, provided supportive therapy I discussed the assessment and treatment plan with the patient. The patient was provided an opportunity to ask questions and all were answered. The patient agreed with the plan and demonstrated an understanding of the instructions.   The patient was advised to call back or seek an in-person evaluation if the symptoms worsen or if the condition fails to improve as anticipated. Time spent non face to face time: 15 min Fu 2 m. Renewed meds [place], MD 01/25/2020, 11:53 AM

## 2020-02-09 ENCOUNTER — Other Ambulatory Visit (HOSPITAL_COMMUNITY): Payer: Self-pay | Admitting: Psychiatry

## 2020-02-09 DIAGNOSIS — F319 Bipolar disorder, unspecified: Secondary | ICD-10-CM

## 2020-02-12 ENCOUNTER — Other Ambulatory Visit (HOSPITAL_COMMUNITY): Payer: Self-pay | Admitting: Psychiatry

## 2020-02-12 DIAGNOSIS — F319 Bipolar disorder, unspecified: Secondary | ICD-10-CM

## 2020-03-03 ENCOUNTER — Other Ambulatory Visit (HOSPITAL_COMMUNITY): Payer: Self-pay | Admitting: Psychiatry

## 2020-03-10 ENCOUNTER — Telehealth (INDEPENDENT_AMBULATORY_CARE_PROVIDER_SITE_OTHER): Payer: BC Managed Care – PPO | Admitting: Psychiatry

## 2020-03-10 ENCOUNTER — Encounter (HOSPITAL_COMMUNITY): Payer: Self-pay | Admitting: Psychiatry

## 2020-03-10 DIAGNOSIS — F319 Bipolar disorder, unspecified: Secondary | ICD-10-CM | POA: Diagnosis not present

## 2020-03-10 DIAGNOSIS — F413 Other mixed anxiety disorders: Secondary | ICD-10-CM

## 2020-03-10 MED ORDER — ALPRAZOLAM 0.5 MG PO TABS
ORAL_TABLET | ORAL | 0 refills | Status: DC
Start: 2020-03-10 — End: 2020-03-14

## 2020-03-10 MED ORDER — BUSPIRONE HCL 7.5 MG PO TABS
7.5000 mg | ORAL_TABLET | Freq: Every day | ORAL | 0 refills | Status: DC
Start: 2020-03-10 — End: 2020-04-19

## 2020-03-10 MED ORDER — LAMOTRIGINE 200 MG PO TABS: 200.0000 mg | ORAL_TABLET | Freq: Every day | ORAL | 0 refills | Status: AC

## 2020-03-10 NOTE — Progress Notes (Signed)
BH MD/PA/NP OP Progress Note  03/10/2020 10:49 AM Rainie Crenshaw  MRN:  [id number]       Virtual Visit via Telephone Note  I connected with [name] on 03/10/20 at 10:30 AM EST by telephone and verified that I am speaking with the correct person using two identifiers.  Location: Patient: home Provider: office   I discussed the limitations, risks, security and privacy concerns of performing an evaluation and management service by telephone and the availability of in person appointments. I also discussed with the patient that there may be a patient responsible charge related to this service. The patient expressed understanding and agreed to proceed.     I discussed the assessment and treatment plan with the patient. The patient was provided an opportunity to ask questions and all were answered. The patient agreed with the plan and demonstrated an understanding of the instructions.   The patient was advised to call back or seek an in-person evaluation if the symptoms worsen or if the condition fails to improve as anticipated.  I provided 11  minutes of non-face-to-face time during this encounter.   Thresa Ross, MD   Chief Complaint:  Follow up bipolar and anxiety    HPI: 60  years old currently married Caucasian female , Moved to [place] work   Doing stable with Immunologist job and medications Sleep, energy better Has made appointment in may with local pscyhiatrist, just needs 2 months medications     No rash   No impulsive decision  Modifying factors  Some friends Aggravating factor: seperation, last job Duration 20 years plus Severity : was high when working  Does not endorse hopelessness or suicidal toughts  Visit Diagnosis:    ICD-10-CM   1. Bipolar I disorder (HCC)  F31.9 ALPRAZolam (XANAX) 0.5 MG tablet    lamoTRIgine (LAMICTAL) 200 MG tablet  2. Other mixed anxiety disorders  F41.3     Past Psychiatric History: see chart  Past  Medical History:  Past Medical History:  Diagnosis Date  . Anxiety    situational  . Arthritis    mild arthritis  . Bipolar 1 disorder (HCC)   . History of kidney stones     Past Surgical History:  Procedure Laterality Date  . CYSTOSCOPY/RETROGRADE/URETEROSCOPY/STONE EXTRACTION WITH BASKET Right 10/08/2015   Procedure: CYSTOSCOPY/RETROGRADE/URETEROSCOPY/STONE EXTRACTION WITH BASKET LASER;  Surgeon: [name], MD;  Location: [facility];  Service: Urology;  Laterality: Right;  . KIDNEY STONE SURGERY    . KNEE ARTHROSCOPY WITH LATERAL MENISECTOMY Left 09/20/2016   Procedure: Left knee arthroscopic partial medial and lateral menisectomy and debridement;  Surgeon: [name], MD;  Location: [facility];  Service: Orthopedics;  Laterality: Left;  60 mins  . TONSILLECTOMY       Family History:  Family History  Problem Relation Age of Onset  . Depression Mother   . Depression Maternal Aunt   . Depression Cousin   . Drug abuse Cousin     Social History:  Social History   Socioeconomic History  . Marital status: Married    Spouse name: Not on file  . Number of children: Not on file  . Years of education: Not on file  . Highest education level: Not on file  Occupational History  . Not on file  Tobacco Use  . Smoking status: Never Smoker  . Smokeless tobacco: Never Used  Vaping Use  . Vaping Use: Never used  Substance and Sexual Activity  . Alcohol use: Yes  Alcohol/week: 1.0 standard drink    Types: 1 Glasses of wine per week    Comment: occasional social  . Drug use: No  . Sexual activity: Yes    Partners: Male    Birth control/protection: None  Other Topics Concern  . Not on file  Social History Narrative  . Not on file   Social Determinants of Health   Financial Resource Strain: Not on file  Food Insecurity: Not on file  Transportation Needs: Not on file  Physical Activity: Not on file  Stress: Not on file  Social Connections: Not on file    Allergies:   Allergies  Allergen Reactions  . Codeine Nausea And Vomiting    Metabolic Disorder Labs: No results found for: HGBA1C, MPG No results found for: PROLACTIN No results found for: CHOL, TRIG, HDL, CHOLHDL, VLDL, LDLCALC No results found for: TSH  Therapeutic Level Labs: No results found for: LITHIUM No results found for: VALPROATE No components found for:  CBMZ  Current Medications: Current Outpatient Medications  Medication Sig Dispense Refill  . ALPRAZolam (XANAX) 0.5 MG tablet Taking at night. Was taking 1mg  bid last year, have tapered 30 tablet 0  . busPIRone (BUSPAR) 7.5 MG tablet Take 1 tablet (7.5 mg total) by mouth daily. 90 tablet 0  . cholecalciferol (VITAMIN D) 1000 units tablet Take 1 tablet by mouth at bedtime.    ibuprofen (ADVIL,MOTRIN) 200 MG tablet Take 600 mg by mouth every 8 (eight) hours as needed for fever, headache, mild pain, moderate pain or cramping.     . lamoTRIgine (LAMICTAL) 200 MG tablet Take 1 tablet (200 mg total) by mouth at bedtime. 90 tablet 0  . QUEtiapine (SEROQUEL) 300 MG tablet TAKE 1 TABLET BY MOUTH ONCE DAILY AT BEDTIME 90 tablet 0   No current facility-administered medications for this visit.      Psychiatric Specialty Exam: Review of Systems  Cardiovascular: Negative for chest pain.  Skin: Negative for rash.  Psychiatric/Behavioral: Negative for substance abuse and suicidal ideas.    There were no vitals taken for this visit.There is no height or weight on file to calculate BMI.  General Appearance:   Eye Contact:    Speech:  Normal Rate  Volume:  Normal  Mood: better  Affect:  Congruent  Thought Process:  Goal Directed  Orientation:  Full (Time, Place, and Person)  Thought Content: Logical   Suicidal Thoughts:  No  Homicidal Thoughts:  No  Memory:  Immediate;   Fair Recent;   Fair  Judgement:  Fair  Insight:  Fair  Psychomotor Activity:  Normal  Concentration:  Concentration: Fair and Attention Span: Fair  Recall:   Good  Fund of Knowledge: Good  Language: Good  Akathisia:  No  Handed:  Right  AIMS (if indicated): no abnomral involuntary movements   Assets:  Desire for Improvement Social Support  ADL's:  Intact  Cognition: WNL  Sleep:  Fair   Screenings: PHQ2-9   Flowsheet Row Video [facility]10[facility]2020 [facility] [place]  PHQ-2 Total Score 0    Flowsheet Row Video Visit from 03/10/2020 in [facility] [place]  C-SSRS RISK CATEGORY No Risk       Assessment and Plan:  Prior documentation reviewed  Bipolar disroder, I  : mixed episode ;doing better on seroquel, lamicta. Continue  Understands to keep a balance job    No tremors.   Anxiety disorder mixed type:  Manageable, continue buspar, low  dose xanax at night only  Can be discharged, meds refilled which were due.  She has appointment in may with local psychaitrist  Thresa Ross, MD 03/10/2020, 10:49 AM

## 2020-03-13 ENCOUNTER — Other Ambulatory Visit (HOSPITAL_COMMUNITY): Payer: Self-pay | Admitting: Psychiatry

## 2020-03-13 DIAGNOSIS — F319 Bipolar disorder, unspecified: Secondary | ICD-10-CM

## 2020-03-14 ENCOUNTER — Telehealth (HOSPITAL_COMMUNITY): Payer: Self-pay | Admitting: Psychiatry

## 2020-03-14 DIAGNOSIS — F319 Bipolar disorder, unspecified: Secondary | ICD-10-CM

## 2020-03-14 MED ORDER — ALPRAZOLAM 0.5 MG PO TABS: ORAL_TABLET | ORAL | 0 refills | Status: AC

## 2020-03-14 MED ORDER — ALPRAZOLAM 0.5 MG PO TABS: 0.5000 mg | ORAL_TABLET | Freq: Every evening | ORAL | 0 refills | Status: DC | PRN

## 2020-03-14 NOTE — Telephone Encounter (Signed)
Ok sent!

## 2020-03-14 NOTE — Telephone Encounter (Signed)
The rx that was sent on 2/24 for xanax was [facility]-  it should have been [facility] to [facility].   Can you please resend that rx.  0.5mg 

## 2020-04-11 ENCOUNTER — Other Ambulatory Visit (HOSPITAL_COMMUNITY): Payer: Self-pay | Admitting: Psychiatry

## 2020-04-12 ENCOUNTER — Telehealth (HOSPITAL_COMMUNITY): Payer: Self-pay | Admitting: Psychiatry

## 2020-04-12 MED ORDER — ALPRAZOLAM 0.5 MG PO TABS: 0.5000 mg | ORAL_TABLET | Freq: Every evening | ORAL | 0 refills | Status: DC | PRN

## 2020-04-12 NOTE — Telephone Encounter (Signed)
Ok sent!

## 2020-04-12 NOTE — Telephone Encounter (Signed)
Pt calling upset She needs refill on xanax.  She requested from pharmacy and it was denied.  She has an apt with new psych in [facility], but it is not until the end of may. She is on cancellation list to get in sooner if available.   [facility] [place]   Cb# [phone]

## 2020-04-18 ENCOUNTER — Other Ambulatory Visit (HOSPITAL_COMMUNITY): Payer: Self-pay | Admitting: Psychiatry

## 2020-05-09 ENCOUNTER — Other Ambulatory Visit (HOSPITAL_COMMUNITY): Payer: Self-pay | Admitting: Psychiatry

## 2020-05-09 DIAGNOSIS — F319 Bipolar disorder, unspecified: Secondary | ICD-10-CM

## 2020-05-10 ENCOUNTER — Other Ambulatory Visit (HOSPITAL_COMMUNITY): Payer: Self-pay | Admitting: Psychiatry

## 2020-05-11 ENCOUNTER — Telehealth (HOSPITAL_COMMUNITY): Payer: Self-pay

## 2020-05-11 MED ORDER — ALPRAZOLAM 0.5 MG PO TABS: 0.5000 mg | ORAL_TABLET | Freq: Every evening | ORAL | 0 refills | Status: AC | PRN

## 2020-05-11 NOTE — Telephone Encounter (Signed)
Ok sent!

## 2020-05-11 NOTE — Telephone Encounter (Signed)
Patient needs a [facility] [address] in [place]. She states she has found a new psychiatrist, but will not see her until May 12th and needs one more refill from [place]. Please advise
